# Patient Record
Sex: Female | Born: 1990 | Race: White | Hispanic: No | Marital: Single | Smoking: Former smoker
Health system: Southern US, Community
[De-identification: ages and names within clinical notes are randomized; demographics above are authoritative.]

## PROBLEM LIST (undated history)

## (undated) DIAGNOSIS — N200 Calculus of kidney: Secondary | ICD-10-CM

## (undated) DIAGNOSIS — S36113A Laceration of liver, unspecified degree, initial encounter: Secondary | ICD-10-CM

## (undated) DIAGNOSIS — N83209 Unspecified ovarian cyst, unspecified side: Secondary | ICD-10-CM

## (undated) HISTORY — DX: Rider (driver) (passenger) of other motorcycle injured in unspecified traffic accident, initial encounter: V29.99XA

## (undated) HISTORY — PX: NO PAST SURGERIES: SHX2092

---
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
## 2010-01-15 ENCOUNTER — Inpatient Hospital Stay (HOSPITAL_COMMUNITY): Admission: EM | Admit: 2010-01-15 | Discharge: 2010-01-16 | Payer: Self-pay | Admitting: Emergency Medicine

## 2011-02-13 LAB — COMPREHENSIVE METABOLIC PANEL
Albumin: 3.1 g/dL — ABNORMAL LOW (ref 3.5–5.2)
BUN: 8 mg/dL (ref 6–23)
Creatinine, Ser: 0.55 mg/dL (ref 0.4–1.2)
Total Protein: 5.2 g/dL — ABNORMAL LOW (ref 6.0–8.3)

## 2011-02-13 LAB — CBC
HCT: 31.7 % — ABNORMAL LOW (ref 36.0–46.0)
Hemoglobin: 12.3 g/dL (ref 12.0–15.0)
MCHC: 33.7 g/dL (ref 30.0–36.0)
MCHC: 33.7 g/dL (ref 30.0–36.0)
MCV: 87.5 fL (ref 78.0–100.0)
Platelets: 119 10*3/uL — ABNORMAL LOW (ref 150–400)
RBC: 4.17 MIL/uL (ref 3.87–5.11)
RDW: 14.8 % (ref 11.5–15.5)

## 2011-02-13 LAB — DIFFERENTIAL
Basophils Absolute: 0 10*3/uL (ref 0.0–0.1)
Basophils Relative: 0 % (ref 0–1)
Eosinophils Relative: 1 % (ref 0–5)
Lymphocytes Relative: 25 % (ref 12–46)
Lymphocytes Relative: 9 % — ABNORMAL LOW (ref 12–46)
Monocytes Absolute: 0.6 10*3/uL (ref 0.1–1.0)
Monocytes Relative: 5 % (ref 3–12)
Neutro Abs: 10.9 10*3/uL — ABNORMAL HIGH (ref 1.7–7.7)
Neutro Abs: 4.1 10*3/uL (ref 1.7–7.7)

## 2011-02-13 LAB — URINALYSIS, ROUTINE W REFLEX MICROSCOPIC
Glucose, UA: NEGATIVE mg/dL
Leukocytes, UA: NEGATIVE
Specific Gravity, Urine: >1.03 — ABNORMAL HIGH (ref 1.005–1.030)
pH: 5 (ref 5.0–8.0)

## 2011-02-13 LAB — RAPID URINE DRUG SCREEN, HOSP PERFORMED: Benzodiazepines: NOT DETECTED

## 2011-02-13 LAB — URINE MICROSCOPIC-ADD ON

## 2011-02-13 LAB — URINE CULTURE: Colony Count: >=100000

## 2011-02-13 LAB — BASIC METABOLIC PANEL
CO2: 25 mEq/L (ref 19–32)
Calcium: 9 mg/dL (ref 8.4–10.5)
GFR calc Af Amer: >60 mL/min (ref >60)
Sodium: 140 mEq/L (ref 135–145)

## 2011-04-12 NOTE — H&P (Signed)
Teresa Gamble, Teresa Gamble                        ACCOUNT NO.:  [id number]   MEDICAL RECORD NO.:  [id number]                   PATIENT TYPE:  INP   LOCATION:  A303                                 FACILITY:  [facility]   PHYSICIAN:  Jeoffrey Massed, M.D.             DATE OF BIRTH:  [birth year]   DATE OF ADMISSION:  08/08/2004  DATE OF DISCHARGE:                                HISTORY & PHYSICAL   CHIEF COMPLAINT:  Fever.   HISTORY OF PRESENT ILLNESS:  Teresa Gamble is a 20 year old white female who has  had three to four days of nausea, non-bilious vomiting, generalized  abdominal pain, and low-back pain, and dysuria.  She reports a fever up to  102.7 at home.  She went to the emergency department at [facility] last  night and labs revealed a urinary tract infection, and a CT of the abdomen  and pelvis showed slight evidence of kidney infection but no urinary tract  stones were seen.  She was given IV fluids for about 30 minutes, and given a  prescription for Bactrim and Phenergan and discharged home.  She came to my  office today for followup and says that she is worse.  She is unable to keep  down even sips of clear fluid, and has not been able to take her Bactrim or  Phenergan because of her nausea and vomiting.  She denies any diarrhea, URI  symptoms, cough, or rash.  No vaginal discharge.  She is presently  menstruating, and her last menstrual period was approximately 28 days ago.   PAST MEDICAL HISTORY:  Nonspecific abdominal pain.  Work up normal, likely  secondary to anxiety, and has responded somewhat to Donnatal p.r.n.  No  history of urinary tract infection or stone.   PAST SURGICAL HISTORY:  None.   MEDICATIONS:  1.  Septra.  2.  Donnatal.  3.  Phenergan.   ALLERGIES:  No known drug allergies.   FAMILY HISTORY:  Mother and paternal grandfather with history of kidneys  stones.  Also with hypertension and type 2 diabetes.   SOCIAL HISTORY:  She lives with her mother and father  and her younger sister  in [place].  She attends high school.  She denies any use of tobacco,  alcohol or drugs.   PHYSICAL EXAMINATION:  VITAL SIGNS:  Temperature 100.2, pulse 115 to 125.  Blood pressure 108/76.  Respirations 18 per minute.  Her weight is 137.8  pounds.  GENERAL:  She is alert but lying on the table in a fetal position, and is  quite dramatic on exam.  She is crying intermittently.  HEENT:  Her tympanic membranes have good light reflex and landmarks  bilaterally without lesion.  Her nasal passages are patent bilaterally.  Oropharynx reveals pink mucosa with slightly tacky mucous membranes.  No  erythema, exudate or swelling.  NECK:  Supple without lymphadenopathy or thyromegaly.  LUNGS:  Clear to auscultation  bilaterally with non-labored respirations.  CARDIOVASCULAR:  Regular rhythm, tachycardic, no murmur.  ABDOMEN:  Soft, moderately tender diffusely without guarding or rebound.  The patient points to the left lower-quadrant as her area of most  tenderness.  Organomegaly difficult to assess because of severe tenderness.  EXTREMITIES:  Capillary refill 1-2 seconds, no edema or cyanosis.  BACK:  Significant CVA tenderness with very light touch bilaterally.   LABORATORY DATA:  In the emergency department, on August 07, 2004, a  catheterized urinalysis showed large blood, 15 ketones, and small  leukocytes.  The microscopic exam showed 11-20 white blood cells per high-  powered field, and 21 to 50 red blood cells per high-powered field.  Urine  pregnancy test was negative.  A hepatic panel was all within normal limits  as was amylase and lipase.  Metabolic 7 panel was within normal limits with  the exception of a sodium of 133.  Complete blood count showed a white blood  cell count of 11,900 with 87% neutrophils, 7% lymphocytes.  Hemoglobin 13.4.  Platelet count 156,000.  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  There are  no urinary tract calcifications noted.  The appendix was not visualized.  There was some scattered air fluid levels in the non-dilated small bowel  consistent with mild ileus versus enteritis.   ASSESSMENT/PLAN:  1.  Pyelonephritis.  The patient seems to have worsened overnight.  2.  The plan is for admission to the hospital with IV Cipro 400 mg q.12h.,      IV Phenergan p.r.n., IV fluids and pain medication as needed.  3.  Complete plan discussed with the patient and mother and they are in      agreement.     ___________________________________________                                         Jeoffrey Massed, M.D.   PHM/MEDQ  D:  08/08/2004  T:  08/08/2004  Job:  [id number]

## 2011-04-12 NOTE — Discharge Summary (Signed)
NAMEJORDANNE, ELSBURY              ACCOUNT NO.:  [id number]   MEDICAL RECORD NO.:  [id number]          PATIENT TYPE:  INP   LOCATION:  A303                          FACILITY:  [facility]   PHYSICIAN:  Jeoffrey Massed, M.D.DATE OF BIRTH:  1991/01/20   DATE OF ADMISSION:  08/08/2004  DATE OF DISCHARGE:  09/17/2005LH                                 DISCHARGE SUMMARY   ADMISSION DIAGNOSES:  1.  Pyelonephritis.  2.  Dehydration.   DISCHARGE DIAGNOSES:  1.  Pyelonephritis.  2.  Dehydration.   DISCHARGE MEDICATION:  Ciprofloxacin 250 mg tabs, one tab by mouth twice  daily for seven days.   CONSULTATIONS:  None.   PROCEDURES:  None.   HISTORY AND PHYSICAL:  For complete H&P, please see dictated H&P in chart.  Briefly, this is a 20 year old white female who was admitted for fever,  flank pain, dysuria, and generalized abdominal pain with vomiting.  She was  found to be dehydrated and had failed one attempt at outpatient management.  She was admitted to the hospital.   HOSPITAL COURSE BY PROBLEM:  1.  Pyelonephritis:  Initial urine culture done in the emergency department      prior to admission was not sent for culture.  The patient was      immediately begun on ciprofloxacin IV in the hospital and a cath urine      sample was sent for culture after the first dose of this medication.      The urine culture came back no growth, but since this was done after the      first dose of antibiotics, she was discharged home on Cipro to complete      a full 10-day course of antibiotics.  Upon admission, she was found to be mildly hypokalemia with a potassium of  3.3, and this was replaced via her IV fluids and was in the normal range on  recheck two days later.  The patient's white blood cell count was normal on  admission and remained so.  Her fever peaked at 103, and this was within the  first 24 hours of hospitalization.  She then defervesced and had been  afebrile for 24 hours prior to  discharge.  1.  Dehydration:  The patient was approximately 5% dehydrated on admission      and was unable to take anything by mouth.  She was immediately started      on IV fluids after admission, and started on a clear liquid diet, and      this was advanced as tolerated.  She gradually was able to tolerate      fluids by mouth, her diet was advanced and was normal by her discharge.      She maintained a good urine output after admission.   PERTINENT LABORATORY DATA:  August 10, 2004:  Electrolytes showed a  sodium of 139, potassium 3.8, chloride 106, bicarbonate 26, BUN 2,  creatinine 0.7, glucose 123, calcium 8.7.  CBC on August 10, 2004, showed  a white blood cell count of 4.1, hemoglobin 10.9, platelets 159,000.  Urine  culture:  No growth one day.   DISPOSITION:  The patient was discharged to home with her mother on the  previously-mentioned discharged medications, in significantly-improved  condition.   FOLLOWUP:  She was told to follow up with our office at [facility] as needed.      PHM/MEDQ  D:  08/15/2004  T:  08/16/2004  Job:  [id number]

## 2011-04-12 NOTE — Discharge Summary (Signed)
Teresa Gamble, Teresa Gamble              ACCOUNT NO.:  [id number]   MEDICAL RECORD NO.:  [id number]          PATIENT TYPE:  INP   LOCATION:  6116                         FACILITY:  [facility]   PHYSICIAN:  Dyann Ruddle, MDDATE OF BIRTH:  [birth year]   DATE OF ADMISSION:  11/26/2005  DATE OF DISCHARGE:  11/27/2005                                 DISCHARGE SUMMARY   HOSPITAL COURSE:  This is a 20 year old female who presented to the ED  following a three day history of flank pain, dysuria, frequent urination,  and a one day history of inability to tolerate food or medicine by mouth due  to nausea and vomiting.  Initial labs at the time of admission showed  urinalysis with positive nitrites, positive moderate leukocyte esterase.  CBC within normal limits and normal electrolytes including a creatinine of  0.8.  The patient's exam was consistent with pyelonephritis including  costovertebral angle tenderness but no signs of suprapubic tenderness.  The  patient was begun on IV Ciprofloxacin for her UTI, IV morphine for pain, and  IV Zofran for nausea.  She responded well to these and was able to tolerate  a regular diet on the evening of November 26, 2005.  On November 27, 2005, the  patient's urine culture grew greater than 10,000 colonies E. coli and the  patient was switched to oral Cipro for management of her infection.  The  patient was also switched to Tylenol #3 for pain.  She continued to tolerate  p.o. medications as well as diet with no further nausea and vomiting.  The  patient is discharged on p.o. antibiotics for a full 14 day course of  treatment for pyelonephritis.   DISCHARGE DIAGNOSIS:  Pyelonephritis.   DISCHARGE MEDICATIONS:  1.  Cipro XR 1000 mg p.o. daily for 12 more days, prescription written.  2.  Tylenol #3, 1-2 tablets p.o. q.4-6h. p.r.n. severe pain, prescription      written.   The patient's discharge weight was 60.6 kilograms.  The patient's discharge  condition is  improved and stable.   DISCHARGE INSTRUCTIONS:  The patient has a follow up appointment scheduled  with her primary MD for Friday, November 29, 2005, at 10:30 a.m.  This is with  Dr. Marvel Plan at [facility], number [phone].  The  patient is to return to the emergency department if unable to tolerate oral  medication or for severe nausea and vomiting, or for fever greater than  102.5 that is not relieved by Motrin.     ______________________________  Pediatrics Resident    ______________________________  Dyann Ruddle, MD    PR/MEDQ  D:  11/27/2005  T:  11/27/2005  Job:  [id number]

## 2011-08-20 ENCOUNTER — Emergency Department (HOSPITAL_COMMUNITY)
Admission: EM | Admit: 2011-08-20 | Discharge: 2011-08-20 | Discharge status: Against Medical Advice | Payer: Self-pay | Attending: Emergency Medicine | Admitting: Emergency Medicine

## 2011-08-20 ENCOUNTER — Emergency Department (HOSPITAL_COMMUNITY)
Admission: EM | Admit: 2011-08-20 | Discharge: 2011-08-21 | Discharge status: Against Medical Advice | Payer: Self-pay | Attending: Emergency Medicine | Admitting: Emergency Medicine

## 2011-08-20 DIAGNOSIS — R52 Pain, unspecified: Secondary | ICD-10-CM | POA: Insufficient documentation

## 2011-08-20 DIAGNOSIS — J989 Respiratory disorder, unspecified: Secondary | ICD-10-CM | POA: Insufficient documentation

## 2011-08-20 DIAGNOSIS — R070 Pain in throat: Secondary | ICD-10-CM | POA: Insufficient documentation

## 2011-08-20 DIAGNOSIS — M545 Low back pain, unspecified: Secondary | ICD-10-CM | POA: Insufficient documentation

## 2011-08-20 DIAGNOSIS — J3489 Other specified disorders of nose and nasal sinuses: Secondary | ICD-10-CM | POA: Insufficient documentation

## 2011-08-20 DIAGNOSIS — R509 Fever, unspecified: Secondary | ICD-10-CM | POA: Insufficient documentation

## 2011-08-20 DIAGNOSIS — R3989 Other symptoms and signs involving the genitourinary system: Secondary | ICD-10-CM | POA: Insufficient documentation

## 2011-08-20 DIAGNOSIS — Z5320 Procedure and treatment not carried out because of patient's decision for unspecified reasons: Secondary | ICD-10-CM | POA: Insufficient documentation

## 2011-08-21 NOTE — ED Notes (Signed)
Pt called numerous times and not found in ed or lobby

## 2011-12-21 ENCOUNTER — Encounter (HOSPITAL_COMMUNITY): Payer: Self-pay | Admitting: Emergency Medicine

## 2011-12-21 ENCOUNTER — Emergency Department (HOSPITAL_COMMUNITY)
Admission: EM | Admit: 2011-12-21 | Discharge: 2011-12-21 | Disposition: A | Discharge status: Home / Self Care | Payer: Self-pay | Attending: Emergency Medicine | Admitting: Emergency Medicine

## 2011-12-21 DIAGNOSIS — R300 Dysuria: Secondary | ICD-10-CM | POA: Insufficient documentation

## 2011-12-21 DIAGNOSIS — R3915 Urgency of urination: Secondary | ICD-10-CM | POA: Insufficient documentation

## 2011-12-21 DIAGNOSIS — R350 Frequency of micturition: Secondary | ICD-10-CM | POA: Insufficient documentation

## 2011-12-21 LAB — URINALYSIS, ROUTINE W REFLEX MICROSCOPIC
Ketones, ur: NEGATIVE mg/dL
Leukocytes, UA: NEGATIVE
Nitrite: NEGATIVE
pH: 6.5 (ref 5.0–8.0)

## 2011-12-21 LAB — GLUCOSE, CAPILLARY

## 2011-12-21 LAB — URINE MICROSCOPIC-ADD ON

## 2011-12-21 MED ORDER — ONDANSETRON HCL 8 MG PO TABS: 8.0000 mg | ORAL_TABLET | Freq: Three times a day (TID) | ORAL | Status: AC | PRN

## 2011-12-21 MED ORDER — CEPHALEXIN 500 MG PO CAPS: 500.0000 mg | ORAL_CAPSULE | Freq: Four times a day (QID) | ORAL | Status: AC

## 2011-12-21 MED ORDER — ONDANSETRON 4 MG PO TBDP
8.0000 mg | ORAL_TABLET | Freq: Once | ORAL | Status: AC
  Administered 2011-12-21: 8 mg via ORAL
  Filled 2011-12-21: qty 2

## 2011-12-21 NOTE — ED Provider Notes (Signed)
History     CSN: [id number]  Arrival date & time 12/21/11  1336   First MD Initiated Contact with Patient 12/21/11 1411      Chief Complaint  Patient presents with  . Urinary Frequency    (Consider location/radiation/quality/duration/timing/severity/associated sxs/prior treatment) Patient is a 21 y.o. female presenting with frequency. The history is provided by the patient and a relative.  Urinary Frequency Pertinent negatives include no chest pain, no abdominal pain, no headaches and no shortness of breath.  pt c/o urinary frequency, urgency for past 1-2 weeks. Hx same. No flank pain. No fever or chills. No abd pain. No nvd. Is on period now, states normal for her, normal time. No vaginal discharge. Hx uncomplicated uti, states saw pcp, [facility]oper, in [place] for same, was given doxy for possible uti, but quit taking as makes nauseated. Pt denies exacerbating or alleviating factors. States no other associated symptoms.   History reviewed. No pertinent past medical history.  No past surgical history on file.  No family history on file.  History  Substance Use Topics  . Smoking status: Current Everyday Smoker -- 0.5 packs/day for 5 years  . Smokeless tobacco: Not on file  . Alcohol Use: No    OB History    Grav Para Term Preterm Abortions TAB SAB Ect Mult Living                  Review of Systems  Constitutional: Negative for fever and chills.  HENT: Negative for neck pain.   Eyes: Negative for redness.  Respiratory: Negative for shortness of breath.   Cardiovascular: Negative for chest pain.  Gastrointestinal: Negative for abdominal pain.  Genitourinary: Positive for dysuria, urgency and frequency. Negative for flank pain.  Musculoskeletal: Negative for back pain.  Skin: Negative for rash.  Neurological: Negative for headaches.  Hematological: Does not bruise/bleed easily.  Psychiatric/Behavioral: Negative for confusion.    Allergies  Review of patient's allergies  indicates no known allergies.  Home Medications   Current Outpatient Rx  Name Route Sig Dispense Refill  . GUAIFENESIN ER 600 MG PO TB12 Oral Take 1,200 mg by mouth 2 (two) times daily.    Marland Kitchen NORGESTIM-ETH ESTRAD TRIPHASIC 0.18/0.215/0.25 MG-35 MCG PO TABS Oral Take 1 tablet by mouth daily.    . AZO-STANDARD PO Oral Take 1 tablet by mouth daily as needed. For UTI pain    . PRESCRIPTION MEDICATION Injection Inject 200 mcg as directed daily. HCG injections      BP 111/82  Pulse 88  Temp(Src) 98.2 F (36.8 C) (Oral)  Resp 16  SpO2 98%  LMP 12/20/2011  Physical Exam  Nursing note and vitals reviewed. Constitutional: She appears well-developed and well-nourished. No distress.  Eyes: Conjunctivae are normal. No scleral icterus.  Neck: Neck supple. No tracheal deviation present.  Cardiovascular: Normal rate.   Pulmonary/Chest: Effort normal. No respiratory distress.  Abdominal: Soft. Normal appearance and bowel sounds are normal. She exhibits no distension and no mass. There is no tenderness. There is no rebound and no guarding.  Genitourinary:       No cva tenderness  Musculoskeletal: She exhibits no edema.  Neurological: She is alert.  Skin: Skin is warm and dry. No rash noted.  Psychiatric: She has a normal mood and affect.    ED Course  Procedures (including critical care time)   Labs Reviewed  POCT PREGNANCY, URINE  URINALYSIS, ROUTINE W REFLEX MICROSCOPIC  POCT PREGNANCY, URINE  POCT CBG MONITORING   Results for  orders placed during the hospital encounter of 12/21/11  URINALYSIS, ROUTINE W REFLEX MICROSCOPIC      Component Value Range   Color, Urine YELLOW  YELLOW    APPearance CLOUDY (*) CLEAR    Specific Gravity, Urine 1.025  1.005 - 1.030    pH 6.5  5.0 - 8.0    Glucose, UA NEGATIVE  NEGATIVE (mg/dL)   Hgb urine dipstick SMALL (*) NEGATIVE    Bilirubin Urine NEGATIVE  NEGATIVE    Ketones, ur NEGATIVE  NEGATIVE (mg/dL)   Protein, ur NEGATIVE  NEGATIVE (mg/dL)     Urobilinogen, UA 1.0  0.0 - 1.0 (mg/dL)   Nitrite NEGATIVE  NEGATIVE    Leukocytes, UA NEGATIVE  NEGATIVE   POCT PREGNANCY, URINE      Component Value Range   Preg Test, Ur NEGATIVE  NEGATIVE   GLUCOSE, CAPILLARY      Component Value Range   Glucose-Capillary 82  70 - 99 (mg/dL)   Comment 1 Documented in Chart    URINE MICROSCOPIC-ADD ON      Component Value Range   Squamous Epithelial / LPF MANY (*) RARE    WBC, UA 0-2  <3 (WBC/hpf)   RBC / HPF 0-2  <3 (RBC/hpf)   Bacteria, UA RARE  RARE        MDM  ua sent.   Last u cx pos for e coli, sens keflex, res cipro. As urinary symptoms persist after report pos ua as outpt, and ?recent partial abx tx for uti, pt/parent request rx for possible partially treated uti (discussed that current ua unremark), will rx keflex. abd soft nt.       Suzi Roots, MD 12/21/11 1515

## 2011-12-21 NOTE — ED Notes (Signed)
Pt. Stated, I went to see Dr. Quintella Reichert in [place] and was told I have a UTI and a sinus infection, Rx for Doxocycline, "made me sick so I didn't take it.  Still sick.  Pt didn't take the medicine.  "I took a shot of an antibiotic when I was there but I don't know what it is."

## 2012-07-30 ENCOUNTER — Encounter (HOSPITAL_COMMUNITY): Payer: Self-pay

## 2012-07-30 ENCOUNTER — Emergency Department (HOSPITAL_COMMUNITY)
Admission: EM | Admit: 2012-07-30 | Discharge: 2012-07-31 | Disposition: A | Discharge status: Home / Self Care | Payer: Self-pay | Attending: Emergency Medicine | Admitting: Emergency Medicine

## 2012-07-30 DIAGNOSIS — R509 Fever, unspecified: Secondary | ICD-10-CM

## 2012-07-30 DIAGNOSIS — Z79899 Other long term (current) drug therapy: Secondary | ICD-10-CM | POA: Insufficient documentation

## 2012-07-30 DIAGNOSIS — A499 Bacterial infection, unspecified: Secondary | ICD-10-CM | POA: Insufficient documentation

## 2012-07-30 DIAGNOSIS — F17200 Nicotine dependence, unspecified, uncomplicated: Secondary | ICD-10-CM | POA: Insufficient documentation

## 2012-07-30 DIAGNOSIS — B9689 Other specified bacterial agents as the cause of diseases classified elsewhere: Secondary | ICD-10-CM | POA: Insufficient documentation

## 2012-07-30 DIAGNOSIS — R109 Unspecified abdominal pain: Secondary | ICD-10-CM

## 2012-07-30 DIAGNOSIS — N390 Urinary tract infection, site not specified: Secondary | ICD-10-CM | POA: Insufficient documentation

## 2012-07-30 DIAGNOSIS — N760 Acute vaginitis: Secondary | ICD-10-CM | POA: Insufficient documentation

## 2012-07-30 LAB — CBC WITH DIFFERENTIAL/PLATELET
HCT: 45.7 % (ref 36.0–46.0)
Hemoglobin: 15.6 g/dL — ABNORMAL HIGH (ref 12.0–15.0)
Lymphocytes Relative: 7 % — ABNORMAL LOW (ref 12–46)
Lymphs Abs: 0.7 10*3/uL (ref 0.7–4.0)
MCHC: 34.1 g/dL (ref 30.0–36.0)
Monocytes Absolute: 0.7 10*3/uL (ref 0.1–1.0)
Monocytes Relative: 8 % (ref 3–12)
Neutro Abs: 8.4 10*3/uL — ABNORMAL HIGH (ref 1.7–7.7)
RBC: 5.11 MIL/uL (ref 3.87–5.11)
WBC: 9.8 10*3/uL (ref 4.0–10.5)

## 2012-07-30 LAB — URINE MICROSCOPIC-ADD ON

## 2012-07-30 LAB — COMPREHENSIVE METABOLIC PANEL
Alkaline Phosphatase: 72 U/L (ref 39–117)
BUN: 7 mg/dL (ref 6–23)
CO2: 26 mEq/L (ref 19–32)
Chloride: 98 mEq/L (ref 96–112)
Creatinine, Ser: 0.95 mg/dL (ref 0.50–1.10)
GFR calc non Af Amer: 84 mL/min — ABNORMAL LOW (ref >90)
Glucose, Bld: 119 mg/dL — ABNORMAL HIGH (ref 70–99)
Total Bilirubin: 0.3 mg/dL (ref 0.3–1.2)

## 2012-07-30 LAB — URINALYSIS, ROUTINE W REFLEX MICROSCOPIC
Hgb urine dipstick: NEGATIVE
Ketones, ur: 15 mg/dL — AB
Protein, ur: 30 mg/dL — AB
Urobilinogen, UA: 1 mg/dL (ref 0.0–1.0)

## 2012-07-30 LAB — POCT PREGNANCY, URINE: Preg Test, Ur: NEGATIVE

## 2012-07-30 MED ORDER — MORPHINE SULFATE 4 MG/ML IJ SOLN
4.0000 mg | Freq: Once | INTRAMUSCULAR | Status: AC
  Administered 2012-07-31: 4 mg via INTRAVENOUS
  Filled 2012-07-30: qty 1

## 2012-07-30 MED ORDER — DEXTROSE 5 % IV SOLN
1.0000 g | Freq: Once | INTRAVENOUS | Status: AC
  Administered 2012-07-31: 1 g via INTRAVENOUS
  Filled 2012-07-30: qty 10

## 2012-07-30 MED ORDER — ONDANSETRON HCL 4 MG/2ML IJ SOLN
4.0000 mg | Freq: Once | INTRAMUSCULAR | Status: AC
  Administered 2012-07-31: 4 mg via INTRAVENOUS
  Filled 2012-07-30: qty 2

## 2012-07-30 MED ORDER — ACETAMINOPHEN 325 MG PO TABS
650.0000 mg | ORAL_TABLET | Freq: Once | ORAL | Status: AC
  Administered 2012-07-30: 650 mg via ORAL
  Filled 2012-07-30: qty 2

## 2012-07-30 MED ORDER — SODIUM CHLORIDE 0.9 % IV BOLUS (SEPSIS)
1000.0000 mL | Freq: Once | INTRAVENOUS | Status: AC
  Administered 2012-07-31: 1000 mL via INTRAVENOUS

## 2012-07-30 NOTE — ED Notes (Signed)
Pt reports waking this am w/all over body aches, lower abd/hip/low back pain, burning w/urination, dizziness and fever. Pt reports hx of kidney stones, reports her symptoms feel similar.

## 2012-07-31 ENCOUNTER — Encounter (HOSPITAL_COMMUNITY): Payer: Self-pay | Admitting: Radiology

## 2012-07-31 ENCOUNTER — Emergency Department (HOSPITAL_COMMUNITY): Payer: Self-pay

## 2012-07-31 LAB — WET PREP, GENITAL
Trich, Wet Prep: NONE SEEN
Yeast Wet Prep HPF POC: NONE SEEN

## 2012-07-31 MED ORDER — OXYCODONE-ACETAMINOPHEN 5-325 MG PO TABS: 2.0000 | ORAL_TABLET | ORAL | Status: AC | PRN

## 2012-07-31 MED ORDER — MORPHINE SULFATE 4 MG/ML IJ SOLN
4.0000 mg | Freq: Once | INTRAMUSCULAR | Status: AC
  Administered 2012-07-31: 4 mg via INTRAVENOUS
  Filled 2012-07-31: qty 1

## 2012-07-31 MED ORDER — IOHEXOL 300 MG/ML  SOLN
100.0000 mL | Freq: Once | INTRAMUSCULAR | Status: AC | PRN
  Administered 2012-07-31: 100 mL via INTRAVENOUS

## 2012-07-31 MED ORDER — IOHEXOL 300 MG/ML  SOLN
20.0000 mL | INTRAMUSCULAR | Status: AC
  Administered 2012-07-31: 20 mL via ORAL

## 2012-07-31 MED ORDER — SODIUM CHLORIDE 0.9 % IV BOLUS (SEPSIS)
1000.0000 mL | Freq: Once | INTRAVENOUS | Status: AC
  Administered 2012-07-31: 1000 mL via INTRAVENOUS

## 2012-07-31 MED ORDER — CIPROFLOXACIN IN D5W 400 MG/200ML IV SOLN
400.0000 mg | Freq: Once | INTRAVENOUS | Status: AC
  Administered 2012-07-31: 400 mg via INTRAVENOUS
  Filled 2012-07-31: qty 200

## 2012-07-31 MED ORDER — CIPROFLOXACIN HCL 500 MG PO TABS: 500.0000 mg | ORAL_TABLET | Freq: Two times a day (BID) | ORAL | Status: AC

## 2012-07-31 MED ORDER — METRONIDAZOLE 0.75 % VA GEL: 1.0000 | Freq: Two times a day (BID) | VAGINAL | Status: AC

## 2012-07-31 NOTE — ED Provider Notes (Signed)
History     CSN: [id number]  Arrival date & time 07/30/12  2025   First MD Initiated Contact with Patient 07/30/12 2333      Chief Complaint  Patient presents with  . Abdominal Pain    (Consider location/radiation/quality/duration/timing/severity/associated sxs/prior treatment) HPI 21 year old female presents emergency department complaining of diffuse body aches, fever, back pain, abdominal pain, dysuria, dizziness, nausea without vomiting. No sick contacts, no rash, no tick bites. Patient with history of urinary tract infections and kidney stones. She reports her flank pain is bilateral slightly worse on the left. She denies any vaginal discharge. No new sexual partners.  History reviewed. No pertinent past medical history.  History reviewed. No pertinent past surgical history.  History reviewed. No pertinent family history.  History  Substance Use Topics  . Smoking status: Current Everyday Smoker -- 0.5 packs/day for 5 years  . Smokeless tobacco: Not on file  . Alcohol Use: No    OB History    Grav Para Term Preterm Abortions TAB SAB Ect Mult Living                  Review of Systems  All other systems reviewed and are negative.    Allergies  Shellfish allergy  Home Medications   Current Outpatient Rx  Name Route Sig Dispense Refill  . ACETAMINOPHEN 500 MG PO TABS Oral Take 1,000 mg by mouth every 6 (six) hours as needed. For pain    . CYANOCOBALAMIN 500 MCG PO TABS Oral Take 500 mcg by mouth daily.    . ADULT MULTIVITAMIN W/MINERALS CH Oral Take 1 tablet by mouth daily.    Marland Kitchen NORGESTIM-ETH ESTRAD TRIPHASIC 0.18/0.215/0.25 MG-35 MCG PO TABS Oral Take 1 tablet by mouth daily.    Marland Kitchen CIPROFLOXACIN HCL 500 MG PO TABS Oral Take 1 tablet (500 mg total) by mouth every 12 (twelve) hours. 10 tablet 0  . METRONIDAZOLE 0.75 % VA GEL Vaginal Place 1 Applicatorful vaginally 2 (two) times daily. 70 g 0  . OXYCODONE-ACETAMINOPHEN 5-325 MG PO TABS Oral Take 2 tablets by mouth  every 4 (four) hours as needed for pain. 20 tablet 0    BP 105/68  Pulse 80  Temp 99.2 F (37.3 C) (Oral)  Resp 16  SpO2 99%  LMP 07/13/2012  Physical Exam  Nursing note and vitals reviewed. Constitutional: She is oriented to person, place, and time. She appears well-developed and well-nourished. She appears distressed (Uncomfortable, ill-appearing).  HENT:  Head: Normocephalic and atraumatic.  Right Ear: External ear normal.  Left Ear: External ear normal.  Nose: Nose normal.  Mouth/Throat: Oropharynx is clear and moist.  Eyes: Conjunctivae and EOM are normal. Pupils are equal, round, and reactive to light.  Neck: Normal range of motion. Neck supple. No JVD present. No tracheal deviation present. No thyromegaly present.  Cardiovascular: Normal rate, regular rhythm, normal heart sounds and intact distal pulses.  Exam reveals no gallop and no friction rub.   No murmur heard. Pulmonary/Chest: Effort normal and breath sounds normal. No stridor. No respiratory distress. She has no wheezes. She has no rales. She exhibits no tenderness.  Abdominal: Soft. Bowel sounds are normal. She exhibits no distension and no mass. There is tenderness (diffuse abdominal pain without rebound or guarding). There is no rebound and no guarding.       Bilateral CVA tenderness  Genitourinary: Uterus normal. Vaginal discharge found.       Thick white profuse discharge. No adnexal tenderness no uterine tenderness no cervical  motion tenderness external genitalia are normal  Musculoskeletal: Normal range of motion. She exhibits no edema and no tenderness.  Lymphadenopathy:    She has no cervical adenopathy.  Neurological: She is alert and oriented to person, place, and time. She exhibits normal muscle tone. Coordination normal.  Skin: Skin is warm and dry. No rash noted. No erythema. No pallor.  Psychiatric: She has a normal mood and affect. Her behavior is normal. Judgment and thought content normal.    ED  Course  Procedures (including critical care time)  Labs Reviewed  CBC WITH DIFFERENTIAL - Abnormal; Notable for the following:    Hemoglobin 15.6 (*)     Neutrophils Relative 86 (*)     Neutro Abs 8.4 (*)     Lymphocytes Relative 7 (*)     All other components within normal limits  COMPREHENSIVE METABOLIC PANEL - Abnormal; Notable for the following:    Glucose, Bld 119 (*)     GFR calc non Af Amer 84 (*)     All other components within normal limits  URINALYSIS, ROUTINE W REFLEX MICROSCOPIC - Abnormal; Notable for the following:    APPearance CLOUDY (*)     Ketones, ur 15 (*)     Protein, ur 30 (*)     Nitrite POSITIVE (*)     Leukocytes, UA SMALL (*)     All other components within normal limits  URINE MICROSCOPIC-ADD ON - Abnormal; Notable for the following:    Squamous Epithelial / LPF FEW (*)     Bacteria, UA MANY (*)     All other components within normal limits  WET PREP, GENITAL - Abnormal; Notable for the following:    Clue Cells Wet Prep HPF POC TOO NUMEROUS TO COUNT (*)     WBC, Wet Prep HPF POC FEW (*)     All other components within normal limits  POCT PREGNANCY, URINE  GC/CHLAMYDIA PROBE AMP, GENITAL  URINE CULTURE   Ct Abdomen Pelvis W Contrast  07/31/2012  *RADIOLOGY REPORT*  Clinical Data: Fever, diffuse abdominal pain, body aches; lower abdominal, hip, and low back pain, burning with urination, dizziness, history kidney stones  CT ABDOMEN AND PELVIS WITH CONTRAST  Technique:  Multidetector CT imaging of the abdomen and pelvis was performed following the standard protocol during bolus administration of intravenous contrast.  Contrast: 1 OMNIPAQUE IOHEXOL 300 MG/ML  SOLN, OMNIPAQUE IOHEXOL 300 MG/ML  SOLN Dilute oral contrast.  Comparison: 06/30/2010  Findings: Lung bases clear. Spleen appears mildly enlarged. Liver, spleen, pancreas, and adrenal glands normal appearance. Symmetric nephrograms with cortical scarring noted at left kidney. No urinary tract  calcification or dilatation. Normal appendix. Unremarkable uterus, adnexae, ureters, bladder. Stomach and bowel loops unremarkable. No mass, adenopathy, free fluid, or inflammatory process. Scattered normal-sized mesenteric lymph nodes. No acute osseous findings.  IMPRESSION: Cortical scarring left kidney. Otherwise negative exam.   Original Report Authenticated By: Lollie Marrow, M.D.      1. Fever   2. Urinary tract infection   3. Abdominal pain   4. Bacterial vaginosis       MDM  21 year old female with abdominal pain fever. Workup shows possible urinary tract infection as source. Patient has received IV Rocephin and Cipro, was sent home on Cipro. Cultures have been sent. Patient feeling better after treatment. She's been given for precautions for return.        Olivia Mackie, MD 07/31/12 [phone]

## 2012-08-01 LAB — URINE CULTURE

## 2012-08-02 NOTE — ED Notes (Signed)
+  Urine. Patient treated with Cipro. Sensitive to same. Per protocol MD. °

## 2012-08-03 LAB — GC/CHLAMYDIA PROBE AMP, GENITAL: Chlamydia, DNA Probe: NEGATIVE

## 2012-11-03 ENCOUNTER — Other Ambulatory Visit (HOSPITAL_COMMUNITY)
Admission: RE | Admit: 2012-11-03 | Discharge: 2012-11-03 | Disposition: A | Discharge status: Home / Self Care | Payer: Self-pay | Source: Ambulatory Visit | Attending: Family Medicine | Admitting: Family Medicine

## 2012-11-03 ENCOUNTER — Emergency Department (INDEPENDENT_AMBULATORY_CARE_PROVIDER_SITE_OTHER)
Admission: EM | Admit: 2012-11-03 | Discharge: 2012-11-03 | Disposition: A | Discharge status: Home / Self Care | Payer: Self-pay | Source: Home / Self Care | Attending: Family Medicine | Admitting: Family Medicine

## 2012-11-03 ENCOUNTER — Encounter (HOSPITAL_COMMUNITY): Payer: Self-pay | Admitting: *Deleted

## 2012-11-03 DIAGNOSIS — N390 Urinary tract infection, site not specified: Secondary | ICD-10-CM

## 2012-11-03 DIAGNOSIS — Z113 Encounter for screening for infections with a predominantly sexual mode of transmission: Secondary | ICD-10-CM | POA: Insufficient documentation

## 2012-11-03 DIAGNOSIS — N946 Dysmenorrhea, unspecified: Secondary | ICD-10-CM

## 2012-11-03 LAB — POCT PREGNANCY, URINE: Preg Test, Ur: NEGATIVE

## 2012-11-03 LAB — POCT URINALYSIS DIP (DEVICE)
Bilirubin Urine: NEGATIVE
Glucose, UA: NEGATIVE mg/dL
Hgb urine dipstick: NEGATIVE
Nitrite: POSITIVE — AB
Specific Gravity, Urine: 1.025 (ref 1.005–1.030)

## 2012-11-03 MED ORDER — IBUPROFEN 800 MG PO TABS
800.0000 mg | ORAL_TABLET | Freq: Once | ORAL | Status: AC
  Administered 2012-11-03: 800 mg via ORAL

## 2012-11-03 MED ORDER — IBUPROFEN 600 MG PO TABS: 600.0000 mg | ORAL_TABLET | Freq: Three times a day (TID) | ORAL | Status: DC | PRN

## 2012-11-03 MED ORDER — HYDROCODONE-ACETAMINOPHEN 5-325 MG PO TABS: 2.0000 | ORAL_TABLET | Freq: Four times a day (QID) | ORAL | Status: DC | PRN

## 2012-11-03 MED ORDER — IBUPROFEN 800 MG PO TABS
ORAL_TABLET | ORAL | Status: AC
  Filled 2012-11-03: qty 1

## 2012-11-03 MED ORDER — PROMETHAZINE HCL 25 MG PO TABS: 25.0000 mg | ORAL_TABLET | Freq: Three times a day (TID) | ORAL | Status: DC | PRN

## 2012-11-03 MED ORDER — CIPROFLOXACIN HCL 500 MG PO TABS: 500.0000 mg | ORAL_TABLET | Freq: Two times a day (BID) | ORAL | Status: DC

## 2012-11-03 NOTE — ED Notes (Signed)
Pt  Reports  Symptoms  Of  Low  abd   Cramping  As  Well  As  Back  Pain    Which  Started  yest     She  denys  Any  Vaginal bleeding or  Discharge        Symptoms  Started  yest           Has  Had  Ovarian  Cyst in past

## 2012-11-03 NOTE — ED Provider Notes (Signed)
History     CSN: [id number]  Arrival date & time 11/03/12  1035   First MD Initiated Contact with Patient 11/03/12 1120      Chief Complaint  Patient presents with  . Abdominal Cramping    (Consider location/radiation/quality/duration/timing/severity/associated sxs/prior treatment) HPI Comments: 21 year old female smoker, with a recurrent history of of urinary tract infection and kidney stones. Was treated in September for UTI and  Her urine culture grew Escherichia coli sensitive to Cipro and cephalosporins. Here complaining of low abdominal cramping and low back pain since last night worse on the left. She is on birth control pills for the last 4 years as she had a history of ovarian cyst. She is due for her period today. Has had frequency but denies burning on urination. Denies vaginal discharge. No fever or chills. No headache. She has felt nauseous but no vomiting. No diarrhea.   History reviewed. No pertinent past medical history.  History reviewed. No pertinent past surgical history.  No family history on file.  History  Substance Use Topics  . Smoking status: Current Every Day Smoker -- 0.5 packs/day for 5 years  . Smokeless tobacco: Not on file  . Alcohol Use: No    OB History    Grav Para Term Preterm Abortions TAB SAB Ect Mult Living                  Review of Systems  Constitutional: Negative for fever, chills, diaphoresis and appetite change.  HENT: Negative for congestion.   Gastrointestinal: Positive for nausea. Negative for vomiting and diarrhea.  Genitourinary: Positive for pelvic pain. Negative for dysuria, vaginal bleeding and vaginal discharge.  Musculoskeletal: Positive for back pain.  Skin: Negative for rash.  Neurological: Negative for dizziness and headaches.    Allergies  Shellfish allergy  Home Medications   Current Outpatient Rx  Name  Route  Sig  Dispense  Refill  . ACETAMINOPHEN 500 MG PO TABS   Oral   Take 1,000 mg by mouth every  6 (six) hours as needed. For pain         . CIPROFLOXACIN HCL 500 MG PO TABS   Oral   Take 1 tablet (500 mg total) by mouth every 12 (twelve) hours.   14 tablet   0   . CYANOCOBALAMIN 500 MCG PO TABS   Oral   Take 500 mcg by mouth daily.         Marland Kitchen HYDROCODONE-ACETAMINOPHEN 5-325 MG PO TABS   Oral   Take 2 tablets by mouth every 6 (six) hours as needed for pain.   10 tablet   0   . IBUPROFEN 600 MG PO TABS   Oral   Take 1 tablet (600 mg total) by mouth every 8 (eight) hours as needed for pain.   20 tablet   0   . ADULT MULTIVITAMIN W/MINERALS CH   Oral   Take 1 tablet by mouth daily.         Marland Kitchen NORGESTIM-ETH ESTRAD TRIPHASIC 0.18/0.215/0.25 MG-35 MCG PO TABS   Oral   Take 1 tablet by mouth daily.         Marland Kitchen PROMETHAZINE HCL 25 MG PO TABS   Oral   Take 1 tablet (25 mg total) by mouth every 8 (eight) hours as needed for nausea.   10 tablet   0     BP 122/70  Pulse 70  Temp 98.6 F (37 C) (Oral)  Resp 16  SpO2 100%  LMP  10/04/2012  Physical Exam  Nursing note and vitals reviewed. Constitutional: She is oriented to person, place, and time. She appears well-developed and well-nourished. No distress.  HENT:  Head: Normocephalic and atraumatic.  Mouth/Throat: Oropharynx is clear and moist. No oropharyngeal exudate.  Eyes: Conjunctivae normal are normal.  Neck: Neck supple. No thyromegaly present.  Cardiovascular: Normal heart sounds.   Pulmonary/Chest: Breath sounds normal.  Abdominal: Soft. Bowel sounds are normal. She exhibits no distension and no mass. There is no rebound and no guarding.       tenderness with deep palpation over suprapubic area. No CVT  Genitourinary: Uterus normal. There is no rash on the right labia. There is no rash on the left labia. Cervix exhibits no motion tenderness. Right adnexum displays no mass, no tenderness and no fullness. Left adnexum displays no mass, no tenderness and no fullness. There is bleeding around the vagina. No  vaginal discharge found.  Neurological: She is alert and oriented to person, place, and time.  Skin: No rash noted.    ED Course  Procedures (including critical care time)  Labs Reviewed  POCT URINALYSIS DIP (DEVICE) - Abnormal; Notable for the following:    Nitrite POSITIVE (*)     All other components within normal limits  POCT PREGNANCY, URINE  CERVICOVAGINAL ANCILLARY ONLY   No results found.   1. UTI (lower urinary tract infection)   2. Menstrual cramps       MDM  21 year old female with history of recurrent Escherichia coli related UTI and kidney stones here with low abdominal cramping and low back pain. She's on day one of her menstrual period just evidenced on examination. Negative pregnancy test. Ovaries not enlarged or palpable. No rebound or other findings suggestive of acute abdomen. Had GC and Chlamydia pending. Nitrates positive findings in your in suggestive of possible UTI. Urine culture pending. Afebrile. Treated with Cipro for 7 days. Prescribed Norco and ibuprofen. Had ibuprofen 800 mg administered here x1 oral. Continue birth control pills as previously prescribed. Asked to followup at the emergency department if vomiting or worsening symptoms despite following treatment. urology refferal as needed.        Sharin Grave, MD 11/04/12 [phone]

## 2012-11-05 LAB — URINE CULTURE: Colony Count: >=100000

## 2012-11-05 NOTE — ED Notes (Signed)
GC/Chlamydia neg., Urine culture:>100,000 colonies E. Coli. Pt. adequately treated with Cipro. Teresa Gamble 11/05/2012

## 2012-12-26 ENCOUNTER — Encounter (HOSPITAL_BASED_OUTPATIENT_CLINIC_OR_DEPARTMENT_OTHER): Payer: Self-pay | Admitting: *Deleted

## 2012-12-26 ENCOUNTER — Emergency Department (HOSPITAL_BASED_OUTPATIENT_CLINIC_OR_DEPARTMENT_OTHER)
Admission: EM | Admit: 2012-12-26 | Discharge: 2012-12-27 | Disposition: A | Discharge status: Home / Self Care | Payer: Self-pay | Attending: Emergency Medicine | Admitting: Emergency Medicine

## 2012-12-26 DIAGNOSIS — M545 Low back pain, unspecified: Secondary | ICD-10-CM | POA: Insufficient documentation

## 2012-12-26 DIAGNOSIS — Z87891 Personal history of nicotine dependence: Secondary | ICD-10-CM | POA: Insufficient documentation

## 2012-12-26 DIAGNOSIS — M549 Dorsalgia, unspecified: Secondary | ICD-10-CM

## 2012-12-26 DIAGNOSIS — Z87442 Personal history of urinary calculi: Secondary | ICD-10-CM | POA: Insufficient documentation

## 2012-12-26 DIAGNOSIS — Z87440 Personal history of urinary (tract) infections: Secondary | ICD-10-CM | POA: Insufficient documentation

## 2012-12-26 DIAGNOSIS — Z3202 Encounter for pregnancy test, result negative: Secondary | ICD-10-CM | POA: Insufficient documentation

## 2012-12-26 DIAGNOSIS — Z79899 Other long term (current) drug therapy: Secondary | ICD-10-CM | POA: Insufficient documentation

## 2012-12-26 LAB — URINE MICROSCOPIC-ADD ON

## 2012-12-26 LAB — PREGNANCY, URINE: Preg Test, Ur: NEGATIVE

## 2012-12-26 LAB — URINALYSIS, ROUTINE W REFLEX MICROSCOPIC
Glucose, UA: NEGATIVE mg/dL
Hgb urine dipstick: NEGATIVE
Protein, ur: NEGATIVE mg/dL
pH: 6.5 (ref 5.0–8.0)

## 2012-12-26 MED ORDER — OXYCODONE-ACETAMINOPHEN 5-325 MG PO TABS: 1.0000 | ORAL_TABLET | Freq: Four times a day (QID) | ORAL | Status: DC | PRN

## 2012-12-26 MED ORDER — ONDANSETRON 4 MG PO TBDP
4.0000 mg | ORAL_TABLET | Freq: Once | ORAL | Status: AC
  Administered 2012-12-26: 4 mg via ORAL
  Filled 2012-12-26: qty 1

## 2012-12-26 MED ORDER — OXYCODONE-ACETAMINOPHEN 5-325 MG PO TABS
2.0000 | ORAL_TABLET | Freq: Once | ORAL | Status: AC
  Administered 2012-12-26: 2 via ORAL
  Filled 2012-12-26 (×2): qty 2

## 2012-12-26 MED ORDER — ONDANSETRON HCL 4 MG PO TABS: 4.0000 mg | ORAL_TABLET | Freq: Three times a day (TID) | ORAL | Status: DC | PRN

## 2012-12-26 NOTE — ED Provider Notes (Signed)
History     CSN: [id number]  Arrival date & time 12/26/12  2303   First MD Initiated Contact with Patient 12/26/12 2303      Chief Complaint  Patient presents with  . Flank Pain    (Consider location/radiation/quality/duration/timing/severity/associated sxs/prior treatment) HPI Pt reports history of UTI and kidney stones, she has had 2 days of moderate aching bilateral but R>L lower back pain, nausea, vomiting and dysuria. She reports temp of 25F yesterday. She was treated for UTI about 6 weeks ago, culture showed pan-sensisitive E.coli at that time. No vaginal bleeding or discharge.   Past Medical History  Diagnosis Date  . Kidney stones     History reviewed. No pertinent past surgical history.  No family history on file.  History  Substance Use Topics  . Smoking status: Former Smoker -- 0.5 packs/day for 5 years  . Smokeless tobacco: Not on file  . Alcohol Use: Yes     Comment: occasional     OB History    Grav Para Term Preterm Abortions TAB SAB Ect Mult Living                  Review of Systems All other systems reviewed and are negative except as noted in HPI.   Allergies  Shellfish allergy  Home Medications   Current Outpatient Rx  Name  Route  Sig  Dispense  Refill  . ACETAMINOPHEN 500 MG PO TABS   Oral   Take 1,000 mg by mouth every 6 (six) hours as needed. For pain         . CIPROFLOXACIN HCL 500 MG PO TABS   Oral   Take 1 tablet (500 mg total) by mouth every 12 (twelve) hours.   14 tablet   0   . CYANOCOBALAMIN 500 MCG PO TABS   Oral   Take 500 mcg by mouth daily.         Marland Kitchen HYDROCODONE-ACETAMINOPHEN 5-325 MG PO TABS   Oral   Take 2 tablets by mouth every 6 (six) hours as needed for pain.   10 tablet   0   . IBUPROFEN 600 MG PO TABS   Oral   Take 1 tablet (600 mg total) by mouth every 8 (eight) hours as needed for pain.   20 tablet   0   . ADULT MULTIVITAMIN W/MINERALS CH   Oral   Take 1 tablet by mouth daily.         Marland Kitchen  NORGESTIM-ETH ESTRAD TRIPHASIC 0.18/0.215/0.25 MG-35 MCG PO TABS   Oral   Take 1 tablet by mouth daily.         Marland Kitchen PROMETHAZINE HCL 25 MG PO TABS   Oral   Take 1 tablet (25 mg total) by mouth every 8 (eight) hours as needed for nausea.   10 tablet   0     BP 120/76  Pulse 75  Temp 98 F (36.7 C) (Oral)  Resp 18  Ht 5\' 3"  (1.6 m)  Wt 155 lb (70.308 kg)  BMI 27.46 kg/m2  SpO2 100%  LMP 11/30/2012  Physical Exam  Nursing note and vitals reviewed. Constitutional: She is oriented to person, place, and time. She appears well-developed and well-nourished.  HENT:  Head: Normocephalic and atraumatic.  Eyes: EOM are normal. Pupils are equal, round, and reactive to light.  Neck: Normal range of motion. Neck supple.  Cardiovascular: Normal rate, normal heart sounds and intact distal pulses.   Pulmonary/Chest: Effort normal and breath sounds  normal.  Abdominal: Bowel sounds are normal. She exhibits no distension. There is no tenderness. There is no rebound and no guarding.  Genitourinary:       No CVA tenderness  Musculoskeletal: Normal range of motion. She exhibits tenderness (lumbar paraspinal tenderness). She exhibits no edema.  Neurological: She is alert and oriented to person, place, and time. She has normal strength. No cranial nerve deficit or sensory deficit.  Skin: Skin is warm and dry. No rash noted.  Psychiatric: She has a normal mood and affect.    ED Course  Procedures (including critical care time)  Labs Reviewed  URINALYSIS, ROUTINE W REFLEX MICROSCOPIC - Abnormal; Notable for the following:    APPearance CLOUDY (*)     Leukocytes, UA TRACE (*)     All other components within normal limits  URINE MICROSCOPIC-ADD ON - Abnormal; Notable for the following:    Squamous Epithelial / LPF MANY (*)     Bacteria, UA FEW (*)     All other components within normal limits  PREGNANCY, URINE   No results found.   No diagnosis found.    MDM  UA neg for blood or  infection. Exam consistent with musculoskeletal back pain. No Red Flags. Feeling better with PO meds. Advised PCP follow up if worsening.         Teresa Gamble B. Bernette Mayers, MD 12/26/12 2355

## 2012-12-26 NOTE — ED Notes (Signed)
C/o bilateral lower flank pain but states  right flank pain that started 2 days ago that is getting progressively worse per pt. Fever last night per pt. C/o chills. C/o n/v.  Hx of UTI's and kidney stones as well.

## 2013-02-16 ENCOUNTER — Encounter (HOSPITAL_COMMUNITY): Payer: Self-pay | Admitting: Emergency Medicine

## 2013-02-16 ENCOUNTER — Emergency Department (HOSPITAL_COMMUNITY)
Admission: EM | Admit: 2013-02-16 | Discharge: 2013-02-17 | Disposition: A | Discharge status: Home / Self Care | Payer: Self-pay | Attending: Emergency Medicine | Admitting: Emergency Medicine

## 2013-02-16 DIAGNOSIS — Z79899 Other long term (current) drug therapy: Secondary | ICD-10-CM | POA: Insufficient documentation

## 2013-02-16 DIAGNOSIS — N390 Urinary tract infection, site not specified: Secondary | ICD-10-CM | POA: Insufficient documentation

## 2013-02-16 DIAGNOSIS — Z8742 Personal history of other diseases of the female genital tract: Secondary | ICD-10-CM | POA: Insufficient documentation

## 2013-02-16 DIAGNOSIS — R109 Unspecified abdominal pain: Secondary | ICD-10-CM | POA: Insufficient documentation

## 2013-02-16 DIAGNOSIS — Z3202 Encounter for pregnancy test, result negative: Secondary | ICD-10-CM | POA: Insufficient documentation

## 2013-02-16 DIAGNOSIS — Z87891 Personal history of nicotine dependence: Secondary | ICD-10-CM | POA: Insufficient documentation

## 2013-02-16 DIAGNOSIS — Z87442 Personal history of urinary calculi: Secondary | ICD-10-CM | POA: Insufficient documentation

## 2013-02-16 HISTORY — DX: Unspecified ovarian cyst, unspecified side: N83.209

## 2013-02-16 HISTORY — DX: Calculus of kidney: N20.0

## 2013-02-16 NOTE — ED Notes (Signed)
PT. REPORTS LOW BACK PAIN AND LOW ABDOMINAL PAIN WITH EMESIS ONSET THIS EVENING , STATES HISTORY OF KIDNEY STONES AND OVARIAN CYST, DENIES DYSURIA OR HEMATURIA.

## 2013-02-17 ENCOUNTER — Emergency Department (HOSPITAL_COMMUNITY): Payer: Self-pay

## 2013-02-17 ENCOUNTER — Encounter (HOSPITAL_COMMUNITY): Payer: Self-pay | Admitting: Radiology

## 2013-02-17 LAB — COMPREHENSIVE METABOLIC PANEL
ALT: 18 U/L (ref 0–35)
Albumin: 4.1 g/dL (ref 3.5–5.2)
Alkaline Phosphatase: 72 U/L (ref 39–117)
Calcium: 9.6 mg/dL (ref 8.4–10.5)
GFR calc Af Amer: >90 mL/min (ref >90)
Glucose, Bld: 94 mg/dL (ref 70–99)
Potassium: 4.2 mEq/L (ref 3.5–5.1)
Sodium: 137 mEq/L (ref 135–145)
Total Protein: 7.2 g/dL (ref 6.0–8.3)

## 2013-02-17 LAB — CBC WITH DIFFERENTIAL/PLATELET
Basophils Relative: 0 % (ref 0–1)
Eosinophils Absolute: 0.4 10*3/uL (ref 0.0–0.7)
Eosinophils Relative: 3 % (ref 0–5)
MCH: 30.6 pg (ref 26.0–34.0)
MCHC: 34.4 g/dL (ref 30.0–36.0)
MCV: 89.1 fL (ref 78.0–100.0)
Neutrophils Relative %: 69 % (ref 43–77)
Platelets: 198 10*3/uL (ref 150–400)
RDW: 12.7 % (ref 11.5–15.5)

## 2013-02-17 LAB — URINALYSIS, ROUTINE W REFLEX MICROSCOPIC
Bilirubin Urine: NEGATIVE
Ketones, ur: NEGATIVE mg/dL
Nitrite: NEGATIVE
Protein, ur: NEGATIVE mg/dL
Specific Gravity, Urine: 1.024 (ref 1.005–1.030)
Urobilinogen, UA: 0.2 mg/dL (ref 0.0–1.0)

## 2013-02-17 LAB — URINE MICROSCOPIC-ADD ON

## 2013-02-17 LAB — POCT PREGNANCY, URINE: Preg Test, Ur: NEGATIVE

## 2013-02-17 MED ORDER — MORPHINE SULFATE 4 MG/ML IJ SOLN
4.0000 mg | Freq: Once | INTRAMUSCULAR | Status: AC
  Administered 2013-02-17: 4 mg via INTRAVENOUS
  Filled 2013-02-17: qty 1

## 2013-02-17 MED ORDER — ONDANSETRON HCL 4 MG/2ML IJ SOLN
4.0000 mg | Freq: Once | INTRAMUSCULAR | Status: AC
  Administered 2013-02-17: 4 mg via INTRAVENOUS
  Filled 2013-02-17: qty 2

## 2013-02-17 MED ORDER — SODIUM CHLORIDE 0.9 % IV BOLUS (SEPSIS)
1000.0000 mL | Freq: Once | INTRAVENOUS | Status: AC
  Administered 2013-02-17: 1000 mL via INTRAVENOUS

## 2013-02-17 MED ORDER — ONDANSETRON HCL 4 MG PO TABS: 4.0000 mg | ORAL_TABLET | Freq: Four times a day (QID) | ORAL | Status: DC

## 2013-02-17 MED ORDER — CEPHALEXIN 500 MG PO CAPS: 500.0000 mg | ORAL_CAPSULE | Freq: Four times a day (QID) | ORAL | Status: DC

## 2013-02-17 MED ORDER — DEXTROSE 5 % IV SOLN
1.0000 g | Freq: Once | INTRAVENOUS | Status: AC
  Administered 2013-02-17: 1 g via INTRAVENOUS
  Filled 2013-02-17: qty 10

## 2013-02-17 MED ORDER — TRAMADOL HCL 50 MG PO TABS: 50.0000 mg | ORAL_TABLET | Freq: Four times a day (QID) | ORAL | Status: DC | PRN

## 2013-02-17 MED ORDER — FLUCONAZOLE 200 MG PO TABS: 200.0000 mg | ORAL_TABLET | Freq: Every day | ORAL | Status: AC

## 2013-02-17 NOTE — ED Provider Notes (Addendum)
History     CSN: [id number]  Arrival date & time 02/16/13  2345   First MD Initiated Contact with Patient 02/16/13 2352      Chief Complaint  Patient presents with  . Back Pain    (Consider location/radiation/quality/duration/timing/severity/associated sxs/prior treatment) Patient is a 22 y.o. female presenting with back pain. The history is provided by the patient.  Back Pain Pain location: left flank. Quality:  Aching and stabbing Radiates to:  Does not radiate Pain severity:  Severe Onset quality:  Sudden Duration:  4 hours Timing:  Constant Progression:  Unchanged Chronicity:  Recurrent Relieved by:  Nothing Worsened by:  Nothing tried Ineffective treatments:  None tried Associated symptoms: abdominal pain and dysuria   Abdominal pain:    Location:  L flank   Severity:  Severe   Onset quality:  Sudden   Timing:  Constant   Chronicity:  Recurrent   Past Medical History  Diagnosis Date  . Renal stones   . Ovarian cyst     History reviewed. No pertinent past surgical history.  No family history on file.  History  Substance Use Topics  . Smoking status: Former Smoker -- 0.50 packs/day for 5 years  . Smokeless tobacco: Not on file  . Alcohol Use: Yes     Comment: occasional     OB History   Grav Para Term Preterm Abortions TAB SAB Ect Mult Living                  Review of Systems  Gastrointestinal: Positive for abdominal pain.  Genitourinary: Positive for dysuria.  Musculoskeletal: Positive for back pain.  All other systems reviewed and are negative.    Allergies  Shellfish allergy and Phenergan  Home Medications   Current Outpatient Rx  Name  Route  Sig  Dispense  Refill  . acetaminophen (TYLENOL) 500 MG tablet   Oral   Take 1,000 mg by mouth every 6 (six) hours as needed. For pain         . ciprofloxacin (CIPRO) 500 MG tablet   Oral   Take 1 tablet (500 mg total) by mouth every 12 (twelve) hours.   14 tablet   0   .  cyanocobalamin 500 MCG tablet   Oral   Take 500 mcg by mouth daily.         Marland Kitchen HYDROcodone-acetaminophen (NORCO/VICODIN) 5-325 MG per tablet   Oral   Take 2 tablets by mouth every 6 (six) hours as needed for pain.   10 tablet   0   . ibuprofen (ADVIL,MOTRIN) 600 MG tablet   Oral   Take 1 tablet (600 mg total) by mouth every 8 (eight) hours as needed for pain.   20 tablet   0   . Multiple Vitamin (MULTIVITAMIN WITH MINERALS) TABS   Oral   Take 1 tablet by mouth daily.         . Norgestimate-Ethinyl Estradiol Triphasic (TRI-SPRINTEC) 0.18/0.215/0.25 MG-35 MCG tablet   Oral   Take 1 tablet by mouth daily.         . ondansetron (ZOFRAN) 4 MG tablet   Oral   Take 1 tablet (4 mg total) by mouth every 8 (eight) hours as needed for nausea.   12 tablet   0   . oxyCODONE-acetaminophen (PERCOCET/ROXICET) 5-325 MG per tablet   Oral   Take 1-2 tablets by mouth every 6 (six) hours as needed for pain.   20 tablet   0   .  promethazine (PHENERGAN) 25 MG tablet   Oral   Take 1 tablet (25 mg total) by mouth every 8 (eight) hours as needed for nausea.   10 tablet   0     BP 125/83  Pulse 89  Temp(Src) 98.9 F (37.2 C) (Oral)  Resp 16  SpO2 98%  LMP 02/01/2013  Physical Exam  Constitutional: She is oriented to person, place, and time. She appears well-developed and well-nourished.  HENT:  Head: Normocephalic and atraumatic.  Eyes: Conjunctivae and EOM are normal. Pupils are equal, round, and reactive to light.  Neck: Normal range of motion.  Cardiovascular: Normal rate, regular rhythm and normal heart sounds.   Pulmonary/Chest: Effort normal and breath sounds normal.  Abdominal: Soft. Bowel sounds are normal. There is tenderness.  lef tflank/suprapubic tenderness  Musculoskeletal: Normal range of motion.  Neurological: She is alert and oriented to person, place, and time.  Skin: Skin is warm and dry.  Psychiatric: She has a normal mood and affect. Her behavior is  normal.    ED Course  Procedures (including critical care time)  Labs Reviewed  URINALYSIS, ROUTINE W REFLEX MICROSCOPIC  CBC WITH DIFFERENTIAL  COMPREHENSIVE METABOLIC PANEL   No results found.   No diagnosis found.    MDM  + abd pain.  Await labs, ct,  Will ivf, analgesia, reassess     + uti,  Will treat dc.  Afeb, tol po.  Fu with pmd   Rosanne Ashing, MD 02/17/13 4098  Rosanne Ashing, MD 02/17/13 [phone]

## 2013-06-09 ENCOUNTER — Emergency Department (HOSPITAL_BASED_OUTPATIENT_CLINIC_OR_DEPARTMENT_OTHER)
Admission: EM | Admit: 2013-06-09 | Discharge: 2013-06-10 | Disposition: A | Discharge status: Home / Self Care | Payer: Self-pay | Attending: Emergency Medicine | Admitting: Emergency Medicine

## 2013-06-09 DIAGNOSIS — Z8742 Personal history of other diseases of the female genital tract: Secondary | ICD-10-CM | POA: Insufficient documentation

## 2013-06-09 DIAGNOSIS — N390 Urinary tract infection, site not specified: Secondary | ICD-10-CM | POA: Insufficient documentation

## 2013-06-09 DIAGNOSIS — K219 Gastro-esophageal reflux disease without esophagitis: Secondary | ICD-10-CM | POA: Insufficient documentation

## 2013-06-09 DIAGNOSIS — R110 Nausea: Secondary | ICD-10-CM | POA: Insufficient documentation

## 2013-06-09 DIAGNOSIS — Z3202 Encounter for pregnancy test, result negative: Secondary | ICD-10-CM | POA: Insufficient documentation

## 2013-06-09 DIAGNOSIS — R6883 Chills (without fever): Secondary | ICD-10-CM | POA: Insufficient documentation

## 2013-06-09 DIAGNOSIS — Z79899 Other long term (current) drug therapy: Secondary | ICD-10-CM | POA: Insufficient documentation

## 2013-06-09 DIAGNOSIS — M545 Low back pain, unspecified: Secondary | ICD-10-CM | POA: Insufficient documentation

## 2013-06-09 DIAGNOSIS — Z87442 Personal history of urinary calculi: Secondary | ICD-10-CM | POA: Insufficient documentation

## 2013-06-09 DIAGNOSIS — Z87891 Personal history of nicotine dependence: Secondary | ICD-10-CM | POA: Insufficient documentation

## 2013-06-09 LAB — URINE MICROSCOPIC-ADD ON

## 2013-06-09 LAB — URINALYSIS, ROUTINE W REFLEX MICROSCOPIC
Bilirubin Urine: NEGATIVE
Hgb urine dipstick: NEGATIVE
Ketones, ur: NEGATIVE mg/dL
Specific Gravity, Urine: 1.024 (ref 1.005–1.030)
Urobilinogen, UA: 0.2 mg/dL (ref 0.0–1.0)

## 2013-06-09 NOTE — ED Notes (Signed)
Pt c/o abd pain since Sunday with back pain and nausea, denies vomiting. Denies fever, but c/o chills.

## 2013-06-10 LAB — GC/CHLAMYDIA PROBE AMP
CT Probe RNA: NEGATIVE
GC Probe RNA: NEGATIVE

## 2013-06-10 LAB — WET PREP, GENITAL: Trich, Wet Prep: NONE SEEN

## 2013-06-10 MED ORDER — ONDANSETRON 8 MG PO TBDP: 8.0000 mg | ORAL_TABLET | Freq: Three times a day (TID) | ORAL | Status: DC | PRN

## 2013-06-10 MED ORDER — NITROFURANTOIN MONOHYD MACRO 100 MG PO CAPS: 100.0000 mg | ORAL_CAPSULE | Freq: Two times a day (BID) | ORAL | Status: DC

## 2013-06-10 MED ORDER — NITROFURANTOIN MONOHYD MACRO 100 MG PO CAPS
100.0000 mg | ORAL_CAPSULE | Freq: Once | ORAL | Status: AC
  Administered 2013-06-10: 100 mg via ORAL
  Filled 2013-06-10: qty 1

## 2013-06-10 MED ORDER — PHENAZOPYRIDINE HCL 200 MG PO TABS: 200.0000 mg | ORAL_TABLET | Freq: Three times a day (TID) | ORAL | Status: DC

## 2013-06-10 MED ORDER — PHENAZOPYRIDINE HCL 100 MG PO TABS
200.0000 mg | ORAL_TABLET | Freq: Once | ORAL | Status: AC
  Administered 2013-06-10: 200 mg via ORAL
  Filled 2013-06-10: qty 2

## 2013-06-10 MED ORDER — PANTOPRAZOLE SODIUM 40 MG PO TBEC
40.0000 mg | DELAYED_RELEASE_TABLET | Freq: Once | ORAL | Status: AC
  Administered 2013-06-10: 40 mg via ORAL
  Filled 2013-06-10: qty 1

## 2013-06-10 MED ORDER — LANSOPRAZOLE 30 MG PO CPDR: 30.0000 mg | DELAYED_RELEASE_CAPSULE | Freq: Every day | ORAL | Status: DC

## 2013-06-10 NOTE — ED Provider Notes (Signed)
History    CSN: [id number] Arrival date & time 06/09/13  2311  First MD Initiated Contact with Patient 06/10/13 0056     Chief Complaint  Patient presents with  . Abdominal Pain   (Consider location/radiation/quality/duration/timing/severity/associated sxs/prior Treatment) HPI 22 year old female with one-week history of pelvic discomfort and low back pain. Symptoms are moderate and are characterized is like previous urinary tract infections. Associated symptoms include of vague abdominal pain that changes position but is currently in the periumbilical region. She is also having chills, nausea but no vomiting. She denies diarrhea, vaginal bleeding, vaginal discharge. She has had acid reflux associated with the nausea. She is only taking Tylenol for the pain and comes for the acid reflux.  Past Medical History  Diagnosis Date  . Renal stones   . Ovarian cyst    No past surgical history on file. No family history on file. History  Substance Use Topics  . Smoking status: Former Smoker -- 0.50 packs/day for 5 years  . Smokeless tobacco: Not on file  . Alcohol Use: Yes     Comment: occasional    OB History   Grav Para Term Preterm Abortions TAB SAB Ect Mult Living                 Review of Systems  All other systems reviewed and are negative.    Allergies  Shellfish allergy and Phenergan  Home Medications   Current Outpatient Rx  Name  Route  Sig  Dispense  Refill  . acetaminophen (TYLENOL) 500 MG tablet   Oral   Take 1,000 mg by mouth every 6 (six) hours as needed. For pain         . cephALEXin (KEFLEX) 500 MG capsule   Oral   Take 1 capsule (500 mg total) by mouth 4 (four) times daily.   20 capsule   0   . ciprofloxacin (CIPRO) 500 MG tablet   Oral   Take 1 tablet (500 mg total) by mouth every 12 (twelve) hours.   14 tablet   0   . cyanocobalamin 500 MCG tablet   Oral   Take 500 mcg by mouth daily.         Marland Kitchen HYDROcodone-acetaminophen (NORCO/VICODIN)  5-325 MG per tablet   Oral   Take 2 tablets by mouth every 6 (six) hours as needed for pain.   10 tablet   0   . ibuprofen (ADVIL,MOTRIN) 600 MG tablet   Oral   Take 1 tablet (600 mg total) by mouth every 8 (eight) hours as needed for pain.   20 tablet   0   . Multiple Vitamin (MULTIVITAMIN WITH MINERALS) TABS   Oral   Take 1 tablet by mouth daily.         . Norgestimate-Ethinyl Estradiol Triphasic (TRI-SPRINTEC) 0.18/0.215/0.25 MG-35 MCG tablet   Oral   Take 1 tablet by mouth daily.         . ondansetron (ZOFRAN) 4 MG tablet   Oral   Take 1 tablet (4 mg total) by mouth every 8 (eight) hours as needed for nausea.   12 tablet   0   . ondansetron (ZOFRAN) 4 MG tablet   Oral   Take 1 tablet (4 mg total) by mouth every 6 (six) hours.   12 tablet   0   . oxyCODONE-acetaminophen (PERCOCET/ROXICET) 5-325 MG per tablet   Oral   Take 1-2 tablets by mouth every 6 (six) hours as needed for pain.  20 tablet   0   . promethazine (PHENERGAN) 25 MG tablet   Oral   Take 1 tablet (25 mg total) by mouth every 8 (eight) hours as needed for nausea.   10 tablet   0   . traMADol (ULTRAM) 50 MG tablet   Oral   Take 1 tablet (50 mg total) by mouth every 6 (six) hours as needed for pain.   15 tablet   0    BP 116/81  Pulse 89  Temp(Src) 98.1 F (36.7 C) (Oral)  Ht 5\' 3"  (1.6 m)  Wt 150 lb (68.04 kg)  BMI 26.58 kg/m2  SpO2 100%  LMP 05/26/2013  Physical Exam General: Well-developed, well-nourished female in no acute distress; appearance consistent with age of record HENT: normocephalic, atraumatic Eyes: pupils equal round and reactive to light; extraocular muscles intact Neck: supple Heart: regular rate and rhythm Lungs: clear to auscultation bilaterally Abdomen: soft; nondistended; mild periumbilical tenderness; no masses or hepatosplenomegaly; bowel sounds present GU: No CVA tenderness; normal external genitalia; no vaginal bleeding; ovarian discharge; no cervical  motion tenderness Extremities: No deformity; full range of motion Neurologic: Awake, alert and oriented; motor function intact in all extremities and symmetric; no facial droop Skin: Warm and dry Psychiatric: Normal mood and affect    ED Course  Procedures (including critical care time)   MDM   Nursing notes and vitals signs, including pulse oximetry, reviewed.  Summary of this visit's results, reviewed by myself:  Labs:  Results for orders placed during the hospital encounter of 06/09/13 (from the past 24 hour(s))  URINALYSIS, ROUTINE W REFLEX MICROSCOPIC     Status: Abnormal   Collection Time    06/09/13 11:30 PM      Result Value Range   Color, Urine YELLOW  YELLOW   APPearance CLEAR  CLEAR   Specific Gravity, Urine 1.024  1.005 - 1.030   pH 6.0  5.0 - 8.0   Glucose, UA NEGATIVE  NEGATIVE mg/dL   Hgb urine dipstick NEGATIVE  NEGATIVE   Bilirubin Urine NEGATIVE  NEGATIVE   Ketones, ur NEGATIVE  NEGATIVE mg/dL   Protein, ur NEGATIVE  NEGATIVE mg/dL   Urobilinogen, UA 0.2  0.0 - 1.0 mg/dL   Nitrite POSITIVE (*) NEGATIVE   Leukocytes, UA SMALL (*) NEGATIVE  PREGNANCY, URINE     Status: None   Collection Time    06/09/13 11:30 PM      Result Value Range   Preg Test, Ur NEGATIVE  NEGATIVE  URINE MICROSCOPIC-ADD ON     Status: Abnormal   Collection Time    06/09/13 11:30 PM      Result Value Range   Squamous Epithelial / LPF FEW (*) RARE   WBC, UA 7-10  <3 WBC/hpf   RBC / HPF 0-2  <3 RBC/hpf   Bacteria, UA MANY (*) RARE  WET PREP, GENITAL     Status: Abnormal   Collection Time    06/10/13  1:10 AM      Result Value Range   Yeast Wet Prep HPF POC NONE SEEN  NONE SEEN   Trich, Wet Prep NONE SEEN  NONE SEEN   Clue Cells Wet Prep HPF POC FEW (*) NONE SEEN   WBC, Wet Prep HPF POC FEW (*) NONE SEEN   We will treat for urinary tract infection and GERD.  Hanley Seamen, MD 06/10/13 0120

## 2013-06-12 LAB — URINE CULTURE: Colony Count: >=100000

## 2013-06-13 ENCOUNTER — Telehealth (HOSPITAL_COMMUNITY): Payer: Self-pay | Admitting: Emergency Medicine

## 2013-06-13 NOTE — ED Notes (Signed)
Post ED Visit - Positive Culture Follow-up  Culture report reviewed by antimicrobial stewardship pharmacist: []  Wes Dulaney, Pharm.D., BCPS []  Celedonio Miyamoto, Pharm.D., BCPS [x]  Georgina Pillion, Pharm.D., BCPS []  [place], [place].D., BCPS, AAHIVP []  Estella Husk, Pharm.D., BCPS, AAHIVP  Positive urine culture Treated with Macrobid, organism sensitive to the same and no further patient follow-up is required at this time.  Teresa Gamble 06/13/2013, 11:24 AM

## 2013-09-08 ENCOUNTER — Emergency Department (HOSPITAL_BASED_OUTPATIENT_CLINIC_OR_DEPARTMENT_OTHER)
Admission: EM | Admit: 2013-09-08 | Discharge: 2013-09-08 | Disposition: A | Discharge status: Home / Self Care | Payer: Self-pay | Attending: Emergency Medicine | Admitting: Emergency Medicine

## 2013-09-08 ENCOUNTER — Emergency Department (HOSPITAL_BASED_OUTPATIENT_CLINIC_OR_DEPARTMENT_OTHER): Payer: Self-pay

## 2013-09-08 ENCOUNTER — Encounter (HOSPITAL_BASED_OUTPATIENT_CLINIC_OR_DEPARTMENT_OTHER): Payer: Self-pay | Admitting: Emergency Medicine

## 2013-09-08 DIAGNOSIS — J329 Chronic sinusitis, unspecified: Secondary | ICD-10-CM | POA: Insufficient documentation

## 2013-09-08 DIAGNOSIS — Z87891 Personal history of nicotine dependence: Secondary | ICD-10-CM | POA: Insufficient documentation

## 2013-09-08 DIAGNOSIS — IMO0001 Reserved for inherently not codable concepts without codable children: Secondary | ICD-10-CM | POA: Insufficient documentation

## 2013-09-08 DIAGNOSIS — Z8742 Personal history of other diseases of the female genital tract: Secondary | ICD-10-CM | POA: Insufficient documentation

## 2013-09-08 DIAGNOSIS — Z87442 Personal history of urinary calculi: Secondary | ICD-10-CM | POA: Insufficient documentation

## 2013-09-08 MED ORDER — AMOXICILLIN-POT CLAVULANATE 875-125 MG PO TABS: 1.0000 | ORAL_TABLET | Freq: Two times a day (BID) | ORAL | Status: DC

## 2013-09-08 NOTE — ED Notes (Signed)
Pt reports head congestion, productive cough, throat pain, nasal congestion and generalized body aches x 2 weeks.  Reports fevers.

## 2013-09-08 NOTE — Discharge Instructions (Signed)
Take Augmentin as directed until gone. Refer to attached documents for more information. Return to the ED with worsening or concerning symptoms.  °

## 2013-09-08 NOTE — ED Provider Notes (Signed)
Medical screening examination/treatment/procedure(s) were performed by non-physician practitioner and as supervising physician I was immediately available for consultation/collaboration.   Charles B. Sheldon, MD 09/08/13 2230 

## 2013-09-08 NOTE — ED Provider Notes (Signed)
CSN: [id number]     Arrival date & time 09/08/13  1606 History   First MD Initiated Contact with Patient 09/08/13 1615     Chief Complaint  Patient presents with  . URI   (Consider location/radiation/quality/duration/timing/severity/associated sxs/prior Treatment) HPI Comments: Patient is a 22 year old female who presents with multiple complaints including head congestion, productive cough, sore throat and body aches for 2 weeks. Symptoms started gradually and progressively worsened since the onset. Patient has tried OTC medications without relief. No aggravating/alleviating factors. No other associated symptoms.   Patient is a 22 y.o. female presenting with URI.  URI Presenting symptoms: cough and sore throat   Associated symptoms: myalgias     Past Medical History  Diagnosis Date  . Renal stones   . Ovarian cyst    History reviewed. No pertinent past surgical history. History reviewed. No pertinent family history. History  Substance Use Topics  . Smoking status: Former Smoker -- 0.50 packs/day for 5 years  . Smokeless tobacco: Not on file  . Alcohol Use: Yes     Comment: occasional    OB History   Grav Para Term Preterm Abortions TAB SAB Ect Mult Living                 Review of Systems  HENT: Positive for sinus pressure and sore throat.   Respiratory: Positive for cough.   Musculoskeletal: Positive for myalgias.  All other systems reviewed and are negative.    Allergies  Shellfish allergy and Phenergan  Home Medications  No current outpatient prescriptions on file. BP 125/74  Pulse 80  Temp(Src) 98.1 F (36.7 C) (Oral)  Resp 18  Ht 5\' 2"  (1.575 m)  Wt 155 lb (70.308 kg)  BMI 28.34 kg/m2  SpO2 99%  LMP 08/20/2013 Physical Exam  Nursing note and vitals reviewed. Constitutional: She is oriented to person, place, and time. She appears well-developed and well-nourished. No distress.  HENT:  Head: Normocephalic and atraumatic.  Right Ear: External ear  normal.  Left Ear: External ear normal.  Mouth/Throat: Oropharynx is clear and moist. No oropharyngeal exudate.  Maxillary sinus tenderness to palpation. Bilateral TM visualized and intact. No mastoid tenderness to palpation or tenderness with auricle retraction.   Eyes: Conjunctivae and EOM are normal. Pupils are equal, round, and reactive to light.  Neck: Normal range of motion.  Cardiovascular: Normal rate and regular rhythm.  Exam reveals no gallop and no friction rub.   No murmur heard. Pulmonary/Chest: Effort normal and breath sounds normal. She has no wheezes. She has no rales. She exhibits no tenderness.  Abdominal: Soft. She exhibits no distension. There is no tenderness. There is no rebound and no guarding.  Musculoskeletal: Normal range of motion.  Lymphadenopathy:    She has cervical adenopathy.  Neurological: She is alert and oriented to person, place, and time. Coordination normal.  Speech is goal-oriented. Moves limbs without ataxia.   Skin: Skin is warm and dry.  Psychiatric: She has a normal mood and affect. Her behavior is normal.    ED Course  Procedures (including critical care time) Labs Review Labs Reviewed  RAPID STREP SCREEN  CULTURE, GROUP A STREP   Imaging Review Dg Chest 2 View  09/08/2013   CLINICAL DATA:  Cough and congestion  EXAM: CHEST  2 VIEW  COMPARISON:  None.  FINDINGS: The heart size and mediastinal contours are within normal limits. Both lungs are clear. The visualized skeletal structures are unremarkable.  IMPRESSION: No active cardiopulmonary  disease.   Electronically Signed   By: Alcide Clever M.D.   On: 09/08/2013 16:33    EKG Interpretation   None       MDM   1. Sinusitis     5:04 PM Chest xray and rapid strep unremarkable. Vitals stable and patient afebrile. Patient will be treated for sinusitis with Augmentin. No further evaluation needed at this time.    Emilia Beck, PA-C 09/08/13 [phone]

## 2014-01-31 ENCOUNTER — Emergency Department (HOSPITAL_COMMUNITY)
Admission: EM | Admit: 2014-01-31 | Discharge: 2014-01-31 | Disposition: A | Discharge status: Home / Self Care | Payer: Self-pay | Attending: Emergency Medicine | Admitting: Emergency Medicine

## 2014-01-31 ENCOUNTER — Encounter (HOSPITAL_COMMUNITY): Payer: Self-pay | Admitting: Emergency Medicine

## 2014-01-31 ENCOUNTER — Emergency Department (HOSPITAL_COMMUNITY): Payer: Self-pay

## 2014-01-31 DIAGNOSIS — M545 Low back pain, unspecified: Secondary | ICD-10-CM | POA: Insufficient documentation

## 2014-01-31 DIAGNOSIS — R109 Unspecified abdominal pain: Secondary | ICD-10-CM | POA: Insufficient documentation

## 2014-01-31 DIAGNOSIS — R509 Fever, unspecified: Secondary | ICD-10-CM | POA: Insufficient documentation

## 2014-01-31 DIAGNOSIS — Z8742 Personal history of other diseases of the female genital tract: Secondary | ICD-10-CM | POA: Insufficient documentation

## 2014-01-31 DIAGNOSIS — R112 Nausea with vomiting, unspecified: Secondary | ICD-10-CM | POA: Insufficient documentation

## 2014-01-31 DIAGNOSIS — N898 Other specified noninflammatory disorders of vagina: Secondary | ICD-10-CM | POA: Insufficient documentation

## 2014-01-31 DIAGNOSIS — Z87448 Personal history of other diseases of urinary system: Secondary | ICD-10-CM | POA: Insufficient documentation

## 2014-01-31 DIAGNOSIS — Z87442 Personal history of urinary calculi: Secondary | ICD-10-CM | POA: Insufficient documentation

## 2014-01-31 DIAGNOSIS — Z87891 Personal history of nicotine dependence: Secondary | ICD-10-CM | POA: Insufficient documentation

## 2014-01-31 DIAGNOSIS — Z3202 Encounter for pregnancy test, result negative: Secondary | ICD-10-CM | POA: Insufficient documentation

## 2014-01-31 DIAGNOSIS — R300 Dysuria: Secondary | ICD-10-CM | POA: Insufficient documentation

## 2014-01-31 DIAGNOSIS — Z79899 Other long term (current) drug therapy: Secondary | ICD-10-CM | POA: Insufficient documentation

## 2014-01-31 LAB — URINALYSIS, ROUTINE W REFLEX MICROSCOPIC
Bilirubin Urine: NEGATIVE
Glucose, UA: NEGATIVE mg/dL
Hgb urine dipstick: NEGATIVE
Ketones, ur: NEGATIVE mg/dL
Leukocytes, UA: NEGATIVE
Nitrite: NEGATIVE
Protein, ur: NEGATIVE mg/dL
Specific Gravity, Urine: 1.022 (ref 1.005–1.030)
Urobilinogen, UA: 0.2 mg/dL (ref 0.0–1.0)
pH: 8 (ref 5.0–8.0)

## 2014-01-31 LAB — CBC WITH DIFFERENTIAL/PLATELET
Basophils Absolute: 0 K/uL (ref 0.0–0.1)
Basophils Relative: 0 % (ref 0–1)
Eosinophils Absolute: 0.3 K/uL (ref 0.0–0.7)
Eosinophils Relative: 4 % (ref 0–5)
HCT: 43.4 % (ref 36.0–46.0)
Hemoglobin: 14.7 g/dL (ref 12.0–15.0)
Lymphocytes Relative: 19 % (ref 12–46)
Lymphs Abs: 1.3 10*3/uL (ref 0.7–4.0)
MCH: 30.8 pg (ref 26.0–34.0)
MCHC: 33.9 g/dL (ref 30.0–36.0)
MCV: 91 fL (ref 78.0–100.0)
Monocytes Absolute: 0.4 10*3/uL (ref 0.1–1.0)
Monocytes Relative: 6 % (ref 3–12)
Neutro Abs: 5.1 K/uL (ref 1.7–7.7)
Neutrophils Relative %: 72 % (ref 43–77)
Platelets: 219 K/uL (ref 150–400)
RBC: 4.77 MIL/uL (ref 3.87–5.11)
RDW: 13.1 % (ref 11.5–15.5)
WBC: 7.2 10*3/uL (ref 4.0–10.5)

## 2014-01-31 LAB — WET PREP, GENITAL
Trich, Wet Prep: NONE SEEN
Yeast Wet Prep HPF POC: NONE SEEN

## 2014-01-31 LAB — COMPREHENSIVE METABOLIC PANEL
ALT: 12 U/L (ref 0–35)
BUN: 15 mg/dL (ref 6–23)
CO2: 24 mEq/L (ref 19–32)
Calcium: 9.1 mg/dL (ref 8.4–10.5)
Creatinine, Ser: 0.76 mg/dL (ref 0.50–1.10)
GFR calc Af Amer: >90 mL/min (ref >90)
GFR calc non Af Amer: >90 mL/min (ref >90)
Glucose, Bld: 91 mg/dL (ref 70–99)

## 2014-01-31 LAB — URINE MICROSCOPIC-ADD ON

## 2014-01-31 LAB — COMPREHENSIVE METABOLIC PANEL WITH GFR
AST: 16 U/L (ref 0–37)
Albumin: 4.1 g/dL (ref 3.5–5.2)
Alkaline Phosphatase: 66 U/L (ref 39–117)
Chloride: 106 meq/L (ref 96–112)
Potassium: 4.3 meq/L (ref 3.7–5.3)
Sodium: 141 meq/L (ref 137–147)
Total Bilirubin: 0.5 mg/dL (ref 0.3–1.2)
Total Protein: 7.1 g/dL (ref 6.0–8.3)

## 2014-01-31 LAB — PREGNANCY, URINE: Preg Test, Ur: NEGATIVE

## 2014-01-31 MED ORDER — DIPHENHYDRAMINE HCL 25 MG PO CAPS
25.0000 mg | ORAL_CAPSULE | Freq: Once | ORAL | Status: DC
  Filled 2014-01-31: qty 1

## 2014-01-31 MED ORDER — ONDANSETRON 4 MG PO TBDP
8.0000 mg | ORAL_TABLET | Freq: Once | ORAL | Status: AC
  Administered 2014-01-31: 8 mg via ORAL
  Filled 2014-01-31: qty 2

## 2014-01-31 MED ORDER — ONDANSETRON HCL 4 MG/2ML IJ SOLN
4.0000 mg | Freq: Once | INTRAMUSCULAR | Status: AC
  Administered 2014-01-31: 4 mg via INTRAVENOUS
  Filled 2014-01-31: qty 2

## 2014-01-31 MED ORDER — SODIUM CHLORIDE 0.9 % IV BOLUS (SEPSIS)
1000.0000 mL | Freq: Once | INTRAVENOUS | Status: AC
  Administered 2014-01-31: 1000 mL via INTRAVENOUS

## 2014-01-31 MED ORDER — MORPHINE SULFATE 4 MG/ML IJ SOLN
4.0000 mg | Freq: Once | INTRAMUSCULAR | Status: AC
  Administered 2014-01-31: 4 mg via INTRAVENOUS
  Filled 2014-01-31: qty 1

## 2014-01-31 MED ORDER — IOHEXOL 300 MG/ML  SOLN
25.0000 mL | INTRAMUSCULAR | Status: AC
  Administered 2014-01-31: 25 mL via ORAL

## 2014-01-31 MED ORDER — IOHEXOL 300 MG/ML  SOLN
100.0000 mL | Freq: Once | INTRAMUSCULAR | Status: AC | PRN
  Administered 2014-01-31: 100 mL via INTRAVENOUS

## 2014-01-31 MED ORDER — ONDANSETRON 8 MG PO TBDP: ORAL_TABLET | ORAL | Status: DC

## 2014-01-31 MED ORDER — LORAZEPAM 2 MG/ML IJ SOLN
1.0000 mg | Freq: Once | INTRAMUSCULAR | Status: AC
  Administered 2014-01-31: 1 mg via INTRAVENOUS
  Filled 2014-01-31: qty 1

## 2014-01-31 MED ORDER — METOCLOPRAMIDE HCL 5 MG/ML IJ SOLN
10.0000 mg | Freq: Once | INTRAMUSCULAR | Status: AC
  Administered 2014-01-31: 10 mg via INTRAVENOUS
  Filled 2014-01-31: qty 2

## 2014-01-31 NOTE — ED Provider Notes (Signed)
Medical screening examination/treatment/procedure(s) were performed by non-physician practitioner and as supervising physician I was immediately available for consultation/collaboration.   EKG Interpretation None        Teresa PoundMichael Y. Amer Alcindor, MD 01/31/14 2241

## 2014-01-31 NOTE — Discharge Instructions (Signed)
1. Medications: zofran, usual home medications 2. Treatment: rest, drink plenty of fluids, advance diet slowly 3. Follow Up: Please followup with your primary doctor for discussion of your diagnoses and further evaluation after today's visit; if you do not have a primary care doctor use the resource guide provided to find one;    Viral Gastroenteritis Viral gastroenteritis is also known as stomach flu. This condition affects the stomach and intestinal tract. It can cause sudden diarrhea and vomiting. The illness typically lasts 3 to 8 days. Most people develop an immune response that eventually gets rid of the virus. While this natural response develops, the virus can make you quite ill. CAUSES  Many different viruses can cause gastroenteritis, such as rotavirus or noroviruses. You can catch one of these viruses by consuming contaminated food or water. You may also catch a virus by sharing utensils or other personal items with an infected person or by touching a contaminated surface. SYMPTOMS  The most common symptoms are diarrhea and vomiting. These problems can cause a severe loss of body fluids (dehydration) and a body salt (electrolyte) imbalance. Other symptoms may include:  Fever.  Headache.  Fatigue.  Abdominal pain. DIAGNOSIS  Your caregiver can usually diagnose viral gastroenteritis based on your symptoms and a physical exam. A stool sample may also be taken to test for the presence of viruses or other infections. TREATMENT  This illness typically goes away on its own. Treatments are aimed at rehydration. The most serious cases of viral gastroenteritis involve vomiting so severely that you are not able to keep fluids down. In these cases, fluids must be given through an intravenous line (IV). HOME CARE INSTRUCTIONS   Drink enough fluids to keep your urine clear or pale yellow. Drink small amounts of fluids frequently and increase the amounts as tolerated.  Ask your caregiver for  specific rehydration instructions.  Avoid:  Foods high in sugar.  Alcohol.  Carbonated drinks.  Tobacco.  Juice.  Caffeine drinks.  Extremely hot or cold fluids.  Fatty, greasy foods.  Too much intake of anything at one time.  Dairy products until 24 to 48 hours after diarrhea stops.  You may consume probiotics. Probiotics are active cultures of beneficial bacteria. They may lessen the amount and number of diarrheal stools in adults. Probiotics can be found in yogurt with active cultures and in supplements.  Wash your hands well to avoid spreading the virus.  Only take over-the-counter or prescription medicines for pain, discomfort, or fever as directed by your caregiver. Do not give aspirin to children. Antidiarrheal medicines are not recommended.  Ask your caregiver if you should continue to take your regular prescribed and over-the-counter medicines.  Keep all follow-up appointments as directed by your caregiver. SEEK IMMEDIATE MEDICAL CARE IF:   You are unable to keep fluids down.  You do not urinate at least once every 6 to 8 hours.  You develop shortness of breath.  You notice blood in your stool or vomit. This may look like coffee grounds.  You have abdominal pain that increases or is concentrated in one small area (localized).  You have persistent vomiting or diarrhea.  You have a fever.  The patient is a child younger than 3 months, and he or she has a fever.  The patient is a child older than 3 months, and he or she has a fever and persistent symptoms.  The patient is a child older than 3 months, and he or she has a fever and  symptoms suddenly get worse.  The patient is a baby, and he or she has no tears when crying. MAKE SURE YOU:   Understand these instructions.  Will watch your condition.  Will get help right away if you are not doing well or get worse. Document Released: 11/11/2005 Document Revised: 02/03/2012 Document Reviewed:  08/28/2011 [facility] Patient Information 2014 [place], [place].   Emergency Department Resource Guide 1) Find a Doctor and Pay Out of Pocket Although you won't have to find out who is covered by your insurance plan, it is a good idea to ask around and get recommendations. You will then need to call the office and see if the doctor you have chosen will accept you as a new patient and what types of options they offer for patients who are self-pay. Some doctors offer discounts or will set up payment plans for their patients who do not have insurance, but you will need to ask so you aren't surprised when you get to your appointment.  2) Contact Your Local Health Department Not all health departments have doctors that can see patients for sick visits, but many do, so it is worth a call to see if yours does. If you don't know where your local health department is, you can check in your phone book. The CDC also has a tool to help you locate your state's health department, and many state websites also have listings of all of their local health departments.  3) Find a Walk-in Clinic If your illness is not likely to be very severe or complicated, you may want to try a walk in clinic. These are popping up all over the country in pharmacies, drugstores, and shopping centers. They're usually staffed by nurse practitioners or physician assistants that have been trained to treat common illnesses and complaints. They're usually fairly quick and inexpensive. However, if you have serious medical issues or chronic medical problems, these are probably not your best option.  No Primary Care Doctor: - Call Health Connect at  [phone] - they can help you locate a primary care doctor that  accepts your insurance, provides certain services, etc. - Physician Referral Service- [phone]  Chronic Pain Problems: Organization         Address  Phone   Notes  [facility]  [phone] Patients need to  be referred by their primary care doctor.   Medication Assistance: Organization         Address  Phone   Notes  [facility] [address] [place], [place] [address] [phone]) [phone] --Must be a resident of [facility] -- Must have NO insurance coverage whatsoever (no Medicaid/ Medicare, etc.) -- The pt. MUST have a primary care doctor that directs their care regularly and follows them in the community   MedAssist  [phone]) [phone]   [employer]  [phone]) [phone]    Agencies that provide inexpensive medical care: Organization         Address  Phone   Notes  [facility]  [phone]) [phone]   [facility]    [phone]   [facility] [address] [place], [place] [address] [phone]   [facility] [address], [place] [phone]) [phone]   [facility]anned Parenthood    [phone]) [phone]   [facility]    [phone]   [facility]  [address], [place]  Phone:  [phone], Fax:  [phone] Hours of Operation:  9 am - 6 pm, M-F.  Also accepts Medicaid/Medicare and self-pay.  [facility]  [address], [place] Phone: [phone], Fax: [phone]. Hours of Operation:  8:30 am - 5:30 pm, M-F.  Also accepts Medicaid and self-pay.  [facility] [place] [address] [place] Phone: [phone]   [facility] [address] Natasha Bence [place], [place] [phone], Ext. [phone] Mondays & Thursdays: 7-9 AM.  First 15 patients are seen on a first come, first serve basis.    Medicaid-accepting [facility] Providers:  Organization         Address  Phone   Notes  [facility] [address], [place] [phone] Also accepts self-pay patients.  [facility] [address] Laurell Josephs [place], [place]  [phone]   [facility] [address], [place] [phone]   [facility] [address], [place] [phone]   Renaye Rakers [address], [place]   [phone] Only accepts [place] Access [place] patients after they have their name applied to their card.   Self-Pay (no insurance) in [facility]:  Organization         Address  Phone   Notes  Sickle Cell Patients, [facility] [address] [place], [place] [phone]   [facility] [address] [place], [place] [phone]   [facility] [place]  [address], [place] [phone]   [facility]/Dr. Osei-Bonsu  [address] [place] or [address], [place] [phone] Phone number for both [place] and [place] locations is the same.  [facility] [address] [place] [phone]   [facility] [address], [place] or [address] [phone]   [facility] [address] [place] [phone], phone; [phone], fax Sees patients 1st and 3rd Saturday of every month.  Must not qualify for public or private insurance (i.e. Medicaid, Medicare, [place] Health Choice, Veterans' Benefits)  Household income should be no more than 200% of the poverty level The clinic cannot treat you if you are pregnant or think you are pregnant  Sexually transmitted diseases are not treated at the clinic.    Dental Care: Organization         Address  Phone  Notes  [facility] Department of [facility] [address] [place], [place] [phone] Accepts children up to age 63 who are enrolled in [place] or [place] Health Choice; pregnant women with a Medicaid card; and children who have applied for Medicaid or [place] Health Choice, but were declined, whose parents can  pay a reduced fee at time of service.  [facility] Department of [facility]  [address], [place] [phone] Accepts children up to age 30 who are enrolled in [place] or [place] Health Choice; pregnant women with a Medicaid card; and children who have applied for Medicaid or [place] Health Choice, but were declined, whose parents can pay a reduced fee at time of service.  [facility]  [address] [place], [place] [phone] Patients are seen by appointment  only. Walk-ins are not accepted. [facility] will see patients 25 years of age and older. Monday - Tuesday (8am-5pm) Most Wednesdays (8:30-5pm) $30 per visit, cash only  [facility]  [address], [facility] [phone] Patients are seen by appointment only. Walk-ins are not accepted. [facility] will see patients 25 years of age and older. One Wednesday Evening (Monthly: Volunteer Based).  $30 per visit, cash only  [employer] [facility]  [phone]) [phone] for adults; Children under age 154, call Graduate Pediatric Dentistry at [phone]. Children aged 234-14, please call [phone] to request a pediatric application.  Dental services are provided in all areas of dental care including fillings, crowns and bridges, complete and partial dentures, implants, gum treatment, root canals, and extractions. Preventive care is also provided. Treatment is provided to both adults and children. Patients are selected via a lottery and there is often a waiting list.   [facility] [address], [place]  [phone]) [phone] [email or web address]   [facility] [address], [place], [place] [phone], Ext. [phone] Second and Fourth Thursday of each month, opens at 6:30 AM; Clinic ends at 9 AM.  Patients are seen on a first-come first-served basis, and a limited number are seen during each clinic.   [facility]  [address], [place], [place] [phone]) [phone]   Eligibility Requirements You must have lived in [place], [place], or [place] counties for at least the last three months.   You cannot be eligible for state or federal sponsored [place] Cityhealthcare insurance, including CIGNAVeterans Administration, [place]icaid, or [employer] EntertainmentMedicare.   You generally cannot be eligible for healthcare insurance through your employer.    How to apply: Eligibility screenings are held every Tuesday and Wednesday afternoon from 1:00 pm until 4:00 pm. You do not need an appointment for the interview!  [facility] [address], [place], [place] [phone]   [facility]  [phone]   [facility]  [phone]   [facility]  [phone]    Behavioral Health Resources in the Community: Intensive Outpatient Programs Organization         Address  Phone  Notes  [facility] [address], [place], [place] [phone]   [facility] [address], [place], [place] [phone]   [facility] [address], [place], [place]  [phone]   [facility] [address],  [place], [place] [phone] or [phone]   Substance Abuse Resources Organization         Address  Phone  Notes  [facility]  [phone]   [facility]  [phone]   [facility]  [phone]   [facility]  [phone]   Residential & Outpatient Substance Abuse Program  [phone]   Psychological Services Organization         Address  Phone  Notes  [facility]  [phone]- [phone]   [facility]  [phone]- [phone]   [facility] [address], [place] [phone] or [phone]    Mobile Crisis Teams Organization         Address  Phone  Notes  [facility], Mobile Crisis Care Unit  [phone]    [facility]  [address] [place], [place] [phone]   [facility] De[facility]sch [address] [place] [phone]  Self-Help/Support Groups Organization         Address  Phone             Notes  [facility] [place] - variety of support groups  Forada Call for more information  Narcotics Anonymous (NA), [facility] [address], [employer] [place]  2 meetings at this location   Special educational needs teacher         Address  Phone  Notes  [facility] [place],    [place]  [phone]   [facility]  [address] [place] [address], [place], [place]   [place], [place] [phone] Admissions: 8am-3pm M-F  Incentives Substance [place] [address],    [place], [place] [phone]   [facility] [address] [place], [place], [place]   [facility] [address]  [place], [place]   [facility] - Intensive Outpatient [address], [place], [place]   [facility] ([address]  [place], [place] [phone] or [phone]   [facility] (RTS) [address], [place], [place] Accepts Medicaid  [facility] [place] [address]  [place] [phone] Substance Abuse/Addiction Treatment   [facility] Organization         Address  Phone  Notes  [facility]  [phone]   Domenic Schwab, PhD [address] Arlis Porta [place], [place]   [phone] or [phone]   [place] [place], [place] [phone]   [facility] [address] [place], [place] [phone] Insurance/Medicaid/sponsorship through [facility] and Families [address]                                     [place], [place] [phone] [facility] [address] [place], [place] [phone]    Dr. Adele Schilder  [phone]   [facility]. 1) [address], [place] 2) [place] 3)  [place] [address], [place] [phone]  [phone]   [place] [phone] or [phone] (After Hours)

## 2014-01-31 NOTE — ED Notes (Signed)
Per pt sts she is having vomiting, cold chills, pain with urination back pain and body aches. sts hx of kidney infection

## 2014-01-31 NOTE — ED Notes (Signed)
CT notified that the pt has finished her contrast.  

## 2014-01-31 NOTE — ED Notes (Signed)
CT notified that the pt has not been taken for her scan.

## 2014-01-31 NOTE — ED Notes (Signed)
Patient helped to the restroom.

## 2014-01-31 NOTE — ED Provider Notes (Signed)
CSN: [id number]     Arrival date & time 01/31/14  1251 History   First MD Initiated Contact with Patient 01/31/14 1535     Chief Complaint  Patient presents with  . Emesis  . Generalized Body Aches  . Back Pain     (Consider location/radiation/quality/duration/timing/severity/associated sxs/prior Treatment) The history is provided by the patient and medical records. No language interpreter was used.    Teresa Gamble is a 23 y.o. female  with a hx of pyelonephritis presents to the Emergency Department complaining of gradual, persistent, progressively worsening generally ill feeling onset several days with the onset of nausea and vomiting at 3am today.  She reports approx 10 episodes of emesis today.  Pt also reports beginning to have low back pain this morning as well.  Pt reports hx of kidney stones as well, but reports this is different.  She reports an aching in her bladder when urination with associated malodor and darker urine.  Pt also with persistent nausea and last emesis approx 30 min ago.  Pt reports a subjective fever last night.   Laying on her side in a ball makes it better and stretching out makes it worse.  Pt denies abd pain, headache, neck pain, chest pain, abd pain, diarrhea, dysuria.  Pt denies Hx of abd surgery.    Past Medical History  Diagnosis Date  . Renal stones   . Ovarian cyst    History reviewed. No pertinent past surgical history. History reviewed. No pertinent family history. History  Substance Use Topics  . Smoking status: Former Smoker -- 0.50 packs/day for 5 years  . Smokeless tobacco: Not on file  . Alcohol Use: Yes     Comment: occasional    OB History   Grav Para Term Preterm Abortions TAB SAB Ect Mult Living                 Review of Systems  Constitutional: Positive for fever (subjective). Negative for diaphoresis, appetite change and fatigue.  Respiratory: Negative for shortness of breath.   Cardiovascular: Negative for chest pain.   Gastrointestinal: Positive for nausea. Negative for vomiting, abdominal pain, diarrhea, constipation and blood in stool.  Endocrine: Negative for polydipsia, polyphagia and polyuria.  Genitourinary: Positive for dysuria and flank pain. Negative for urgency, frequency, hematuria and difficulty urinating.  Musculoskeletal: Positive for back pain.  Skin: Negative for rash.  Allergic/Immunologic: Negative for immunocompromised state.  Neurological: Negative for headaches.  Hematological: Negative for adenopathy. Does not bruise/bleed easily.  All other systems reviewed and are negative.      Allergies  Shellfish allergy and Phenergan  Home Medications   Current Outpatient Rx  Name  Route  Sig  Dispense  Refill  . ibuprofen (ADVIL,MOTRIN) 200 MG tablet   Oral   Take 400 mg by mouth every 6 (six) hours as needed for fever, headache or moderate pain.         Marland Kitchen OVER THE COUNTER MEDICATION   Oral   Take 1 capsule by mouth daily. METABOLISM BOOSTER         . vitamin B-12 (CYANOCOBALAMIN) 1000 MCG tablet   Oral   Take 1,000 mcg by mouth daily.         . ondansetron (ZOFRAN ODT) 8 MG disintegrating tablet      8mg  ODT q4 hours prn nausea   8 tablet   0    BP 110/69  Pulse 75  Temp(Src) 98.1 F (36.7 C) (Oral)  Resp 20  SpO2 100%  LMP 01/10/2014 Physical Exam  Nursing note and vitals reviewed. Constitutional: She is oriented to person, place, and time. She appears well-developed and well-nourished. She appears distressed.  Awake, alert,  Pt actively vomiting  HENT:  Head: Normocephalic and atraumatic.  Mouth/Throat: Oropharynx is clear and moist. No oropharyngeal exudate.  Eyes: Conjunctivae are normal. Pupils are equal, round, and reactive to light. No scleral icterus.  Neck: Normal range of motion. Neck supple.  Full ROM without pain  Cardiovascular: Normal rate, regular rhythm, normal heart sounds and intact distal pulses.   No murmur heard. No tachycardia   Pulmonary/Chest: Effort normal and breath sounds normal. No respiratory distress. She has no wheezes.  Clear and equal breath sounds  Abdominal: Soft. Normal appearance and bowel sounds are normal. She exhibits no distension and no mass. There is no hepatosplenomegaly. There is no tenderness. There is CVA tenderness (bilateral; left > right). There is no rebound and no guarding. Hernia confirmed negative in the right inguinal area and confirmed negative in the left inguinal area.  Genitourinary: Uterus normal. Pelvic exam was performed with patient supine. There is no rash, tenderness, lesion or injury on the right labia. There is no rash, tenderness, lesion or injury on the left labia. Uterus is not deviated, not enlarged, not fixed and not tender. Cervix exhibits no motion tenderness, no discharge and no friability. Right adnexum displays no mass, no tenderness and no fullness. Left adnexum displays no mass, no tenderness and no fullness. No erythema, tenderness or bleeding around the vagina. No foreign body around the vagina. No signs of injury around the vagina. Vaginal discharge (thin, white, moderate) found.  Musculoskeletal: Normal range of motion. She exhibits no edema.       Cervical back: Normal.       Thoracic back: Normal.       Lumbar back: She exhibits tenderness and pain.  Full range of motion of the T-spine and L-spine No tenderness to palpation of the spinous processes of the T-spine or L-spine Mild tenderness to palpation of the bilateral paraspinous muscles of the L-spine  Lymphadenopathy:    She has no cervical adenopathy.       Right: No inguinal adenopathy present.       Left: No inguinal adenopathy present.  Neurological: She is alert and oriented to person, place, and time. She has normal reflexes. She exhibits normal muscle tone. Coordination normal.  Speech is clear and goal oriented, follows commands Normal strength in upper and lower extremities bilaterally including  dorsiflexion and plantar flexion, strong and equal grip strength Sensation normal to light and sharp touch Moves extremities without ataxia, coordination intact Normal gait Normal balance   Skin: Skin is warm and dry. No rash noted. She is not diaphoretic. No erythema.  Psychiatric: She has a normal mood and affect. Her behavior is normal.    ED Course  Procedures (including critical care time) Labs Review Labs Reviewed  WET PREP, GENITAL - Abnormal; Notable for the following:    Clue Cells Wet Prep HPF POC MODERATE (*)    WBC, Wet Prep HPF POC FEW (*)    All other components within normal limits  URINALYSIS, ROUTINE W REFLEX MICROSCOPIC - Abnormal; Notable for the following:    APPearance TURBID (*)    All other components within normal limits  URINE MICROSCOPIC-ADD ON - Abnormal; Notable for the following:    Squamous Epithelial / LPF FEW (*)    Bacteria, UA FEW (*)  All other components within normal limits  GC/CHLAMYDIA PROBE AMP  CBC WITH DIFFERENTIAL  COMPREHENSIVE METABOLIC PANEL  PREGNANCY, URINE  POC URINE PREG, ED   Imaging Review Ct Abdomen Pelvis W Contrast  01/31/2014   CLINICAL DATA:  Vomiting and back pain  EXAM: CT ABDOMEN AND PELVIS WITH CONTRAST  TECHNIQUE: Multidetector CT imaging of the abdomen and pelvis was performed using the standard protocol following bolus administration of intravenous contrast.  CONTRAST:  100mL OMNIPAQUE IOHEXOL 300 MG/ML  SOLN  COMPARISON:  02/17/2013  FINDINGS: Lung bases are free of acute infiltrate or sizable effusion. The liver, gallbladder, spleen, adrenal glands and pancreas are within normal limits. The kidneys are well visualized bilaterally. No renal calculi or obstructive changes are seen. Mild scarring is noted in the left kidney stable from the prior exam. The appendix is within normal limits. The bladder is well does tendon. No pelvic mass lesion is seen. An irregular enhancing air is noted within the left ovary which may  represent a involuting cyst. No other focal abnormality is seen.  IMPRESSION: Possible left involuting ovarian cyst. No other focal abnormality is seen.  Chronic scarring in the left kidney is noted.   Electronically Signed   By: Alcide CleverMark  Lukens M.D.   On: 01/31/2014 21:34     EKG Interpretation None      MDM   Final diagnoses:  Nausea and vomiting  Low back pain   Glean SalenRebecca N Willert presents with subjective fevers, flank pain and emesis.  She reports concern about pyelonephritis however urinalysis without evidence of urinary tract infection. Negative pregnancy test. Patient also with history of kidney stones but reports this pain is different. CBC and CMP unremarkable.  7:05 PM Patient continues to vomit.  Will give further treatment and reassess.    10:07 PM Patient without emesis and several hours. She was able to drink contrast for her CT scan.  CT with likely developing left ovarian cyst however she had no adnexal mass or tenderness on exam.  No other acute findings on her CT scan.    I personally reviewed the imaging tests through PACS system  I reviewed available ER/hospitalization records through the EMR  Other labs unremarkable. No cervical motion tenderness or evidence of pelvic inflammatory disease.  BP 110/69  Pulse 75  Temp(Src) 98.1 F (36.7 C) (Oral)  Resp 20  SpO2 100%  LMP 01/10/2014  Will PO trial and reassess.    10:34 PM Pt abdomen remained soft and nontender. Vomiting stopped. Patient tolerating by mouth department.  She is alert, oriented, nontoxic and nonseptic appearing. She has remained afebrile and not tachycardic.  Patient with symptoms consistent with viral gastritis.  Vitals are stable, no fever.  Patient is nontoxic, nonseptic appearing, in no apparent distress.  Patient does not meet the SIRS or Sepsis criteria.  Pt's symptoms have been managed in the department; fluid bolus given.  No signs of dehydration, tolerating PO fluids > 6 oz.  Lungs are  clear.  No focal abdominal pain, no peritoneal signs, no concern for appendicitis, cholecystitis, pancreatitis, ruptured viscus, UTI, kidney stone, PID, ectopic pregnancy or any other abdominal etiology.  Supportive therapy indicated with return if symptoms worsen.  Patient counseled.  I have also discussed reasons to return immediately to the ER.  Patient expresses understanding and agrees with plan.  It has been determined that no acute conditions requiring further emergency intervention are present at this time. The patient/guardian have been advised of the diagnosis and plan.  We have discussed signs and symptoms that warrant return to the ED, such as changes or worsening in symptoms.   Vital signs are stable at discharge.   BP 110/69  Pulse 75  Temp(Src) 98.1 F (36.7 C) (Oral)  Resp 20  SpO2 100%  LMP 01/10/2014  Patient/guardian has voiced understanding and agreed to follow-up with the PCP or specialist.        Dierdre Forth, PA-C 01/31/14 2240

## 2014-01-31 NOTE — ED Notes (Signed)
Patient complained of starting to vomit again. Patient states," I just throw up that yellow acid. It is starting to make my throat hurt." PA notified.

## 2014-02-01 LAB — GC/CHLAMYDIA PROBE AMP
CT Probe RNA: NEGATIVE
GC PROBE AMP APTIMA: NEGATIVE

## 2014-03-06 ENCOUNTER — Encounter (HOSPITAL_COMMUNITY): Payer: Self-pay | Admitting: Emergency Medicine

## 2014-03-06 ENCOUNTER — Emergency Department (HOSPITAL_COMMUNITY)
Admission: EM | Admit: 2014-03-06 | Discharge: 2014-03-06 | Disposition: A | Discharge status: Home / Self Care | Payer: Self-pay | Attending: Emergency Medicine | Admitting: Emergency Medicine

## 2014-03-06 ENCOUNTER — Emergency Department (HOSPITAL_COMMUNITY): Payer: Self-pay

## 2014-03-06 DIAGNOSIS — Z79899 Other long term (current) drug therapy: Secondary | ICD-10-CM | POA: Insufficient documentation

## 2014-03-06 DIAGNOSIS — M899 Disorder of bone, unspecified: Principal | ICD-10-CM | POA: Insufficient documentation

## 2014-03-06 DIAGNOSIS — O200 Threatened abortion: Secondary | ICD-10-CM | POA: Insufficient documentation

## 2014-03-06 DIAGNOSIS — O2390 Unspecified genitourinary tract infection in pregnancy, unspecified trimester: Secondary | ICD-10-CM | POA: Insufficient documentation

## 2014-03-06 DIAGNOSIS — M259 Joint disorder, unspecified: Secondary | ICD-10-CM | POA: Insufficient documentation

## 2014-03-06 DIAGNOSIS — M545 Low back pain, unspecified: Secondary | ICD-10-CM | POA: Insufficient documentation

## 2014-03-06 DIAGNOSIS — M549 Dorsalgia, unspecified: Secondary | ICD-10-CM

## 2014-03-06 DIAGNOSIS — N390 Urinary tract infection, site not specified: Secondary | ICD-10-CM | POA: Insufficient documentation

## 2014-03-06 DIAGNOSIS — Z87442 Personal history of urinary calculi: Secondary | ICD-10-CM | POA: Insufficient documentation

## 2014-03-06 DIAGNOSIS — O210 Mild hyperemesis gravidarum: Secondary | ICD-10-CM | POA: Insufficient documentation

## 2014-03-06 DIAGNOSIS — Z3490 Encounter for supervision of normal pregnancy, unspecified, unspecified trimester: Secondary | ICD-10-CM

## 2014-03-06 DIAGNOSIS — O9989 Other specified diseases and conditions complicating pregnancy, childbirth and the puerperium: Principal | ICD-10-CM

## 2014-03-06 DIAGNOSIS — Z87891 Personal history of nicotine dependence: Secondary | ICD-10-CM | POA: Insufficient documentation

## 2014-03-06 DIAGNOSIS — R112 Nausea with vomiting, unspecified: Secondary | ICD-10-CM

## 2014-03-06 LAB — URINALYSIS, ROUTINE W REFLEX MICROSCOPIC
BILIRUBIN URINE: NEGATIVE
Glucose, UA: NEGATIVE mg/dL
Ketones, ur: 15 mg/dL — AB
Nitrite: POSITIVE — AB
PROTEIN: NEGATIVE mg/dL
Specific Gravity, Urine: 1.026 (ref 1.005–1.030)
UROBILINOGEN UA: 0.2 mg/dL (ref 0.0–1.0)
pH: 6 (ref 5.0–8.0)

## 2014-03-06 LAB — CBC WITH DIFFERENTIAL/PLATELET
BASOS ABS: 0.1 10*3/uL (ref 0.0–0.1)
Basophils Relative: 1 % (ref 0–1)
Eosinophils Absolute: 0.2 10*3/uL (ref 0.0–0.7)
Eosinophils Relative: 2 % (ref 0–5)
HEMATOCRIT: 41 % (ref 36.0–46.0)
HEMOGLOBIN: 14.3 g/dL (ref 12.0–15.0)
LYMPHS PCT: 20 % (ref 12–46)
Lymphs Abs: 1.9 10*3/uL (ref 0.7–4.0)
MCH: 31 pg (ref 26.0–34.0)
MCHC: 34.9 g/dL (ref 30.0–36.0)
MCV: 88.7 fL (ref 78.0–100.0)
MONOS PCT: 6 % (ref 3–12)
Monocytes Absolute: 0.6 10*3/uL (ref 0.1–1.0)
Neutro Abs: 6.6 10*3/uL (ref 1.7–7.7)
Neutrophils Relative %: 71 % (ref 43–77)
Platelets: 227 10*3/uL (ref 150–400)
RBC: 4.62 MIL/uL (ref 3.87–5.11)
RDW: 12.8 % (ref 11.5–15.5)
WBC: 9.3 10*3/uL (ref 4.0–10.5)

## 2014-03-06 LAB — WET PREP, GENITAL
Clue Cells Wet Prep HPF POC: NONE SEEN
Trich, Wet Prep: NONE SEEN
Yeast Wet Prep HPF POC: NONE SEEN

## 2014-03-06 LAB — COMPREHENSIVE METABOLIC PANEL
ALT: 12 U/L (ref 0–35)
AST: 14 U/L (ref 0–37)
Albumin: 4.3 g/dL (ref 3.5–5.2)
Alkaline Phosphatase: 65 U/L (ref 39–117)
BILIRUBIN TOTAL: 0.8 mg/dL (ref 0.3–1.2)
BUN: 13 mg/dL (ref 6–23)
CHLORIDE: 100 meq/L (ref 96–112)
CO2: 21 meq/L (ref 19–32)
CREATININE: 0.68 mg/dL (ref 0.50–1.10)
Calcium: 9.1 mg/dL (ref 8.4–10.5)
GFR calc Af Amer: >90 mL/min (ref >90)
Glucose, Bld: 87 mg/dL (ref 70–99)
Potassium: 3.4 mEq/L — ABNORMAL LOW (ref 3.7–5.3)
Sodium: 139 mEq/L (ref 137–147)
Total Protein: 7.6 g/dL (ref 6.0–8.3)

## 2014-03-06 LAB — URINE MICROSCOPIC-ADD ON

## 2014-03-06 LAB — LIPASE, BLOOD: Lipase: 24 U/L (ref 11–59)

## 2014-03-06 LAB — PREGNANCY, URINE: Preg Test, Ur: POSITIVE — AB

## 2014-03-06 LAB — HCG, QUANTITATIVE, PREGNANCY: hCG, Beta Chain, Quant, S: 873 m[IU]/mL — ABNORMAL HIGH (ref <5)

## 2014-03-06 LAB — ABO/RH: ABO/RH(D): O POS

## 2014-03-06 MED ORDER — DEXTROSE 5 % IV SOLN: 1.0000 g | Freq: Once | INTRAVENOUS | Status: DC

## 2014-03-06 MED ORDER — ONDANSETRON 4 MG PO TBDP: 4.0000 mg | ORAL_TABLET | Freq: Three times a day (TID) | ORAL | Status: DC | PRN

## 2014-03-06 MED ORDER — SODIUM CHLORIDE 0.9 % IV BOLUS (SEPSIS)
1000.0000 mL | Freq: Once | INTRAVENOUS | Status: AC
  Administered 2014-03-06: 1000 mL via INTRAVENOUS

## 2014-03-06 MED ORDER — DEXTROSE 5 % IV SOLN
1.0000 g | INTRAVENOUS | Status: DC
  Administered 2014-03-06: 1 g via INTRAVENOUS
  Filled 2014-03-06: qty 10

## 2014-03-06 MED ORDER — MORPHINE SULFATE 4 MG/ML IJ SOLN
4.0000 mg | Freq: Once | INTRAMUSCULAR | Status: AC
  Administered 2014-03-06: 4 mg via INTRAVENOUS
  Filled 2014-03-06: qty 1

## 2014-03-06 MED ORDER — CEPHALEXIN 500 MG PO CAPS: 500.0000 mg | ORAL_CAPSULE | Freq: Two times a day (BID) | ORAL | Status: DC

## 2014-03-06 MED ORDER — KETOROLAC TROMETHAMINE 30 MG/ML IJ SOLN
30.0000 mg | Freq: Once | INTRAMUSCULAR | Status: AC
  Administered 2014-03-06: 30 mg via INTRAVENOUS
  Filled 2014-03-06: qty 1

## 2014-03-06 MED ORDER — ONDANSETRON HCL 4 MG/2ML IJ SOLN
4.0000 mg | Freq: Once | INTRAMUSCULAR | Status: AC
  Administered 2014-03-06: 4 mg via INTRAVENOUS
  Filled 2014-03-06: qty 2

## 2014-03-06 MED ORDER — METOCLOPRAMIDE HCL 5 MG/ML IJ SOLN
10.0000 mg | Freq: Once | INTRAMUSCULAR | Status: AC
  Administered 2014-03-06: 10 mg via INTRAVENOUS
  Filled 2014-03-06: qty 2

## 2014-03-06 MED ORDER — MORPHINE SULFATE 2 MG/ML IJ SOLN
2.0000 mg | Freq: Once | INTRAMUSCULAR | Status: AC
  Administered 2014-03-06: 2 mg via INTRAVENOUS
  Filled 2014-03-06: qty 1

## 2014-03-06 NOTE — ED Provider Notes (Signed)
4:15 PM = Received sign-out from [facility]. Await [place]. Follow-up with OB/GYN for repeat Beta-HCG. Treat for UTI. Sent home with Zofran for nausea.   Filed Vitals:   03/06/14 1530 03/06/14 1600 03/06/14 1720 03/06/14 1857  BP: 102/66 104/64 103/68 117/71  Pulse: 66 67 69 62  Temp:      TempSrc:      Resp:   15 16  SpO2: 99% 100% 99% 100%    Results for orders placed during the hospital encounter of 03/06/14  WET PREP, GENITAL      Result Value Ref Range   Yeast Wet Prep HPF POC NONE SEEN  NONE SEEN   Trich, Wet Prep NONE SEEN  NONE SEEN   Clue Cells Wet Prep HPF POC NONE SEEN  NONE SEEN   WBC, Wet Prep HPF POC FEW (*) NONE SEEN  CBC WITH DIFFERENTIAL      Result Value Ref Range   WBC 9.3  4.0 - 10.5 K/uL   RBC 4.62  3.87 - 5.11 MIL/uL   Hemoglobin 14.3  12.0 - 15.0 g/dL   HCT 16.1  09.6 - 04.5 %   MCV 88.7  78.0 - 100.0 fL   MCH 31.0  26.0 - 34.0 pg   MCHC 34.9  30.0 - 36.0 g/dL   RDW 40.9  81.1 - 91.4 %   Platelets 227  150 - 400 K/uL   Neutrophils Relative % 71  43 - 77 %   Neutro Abs 6.6  1.7 - 7.7 K/uL   Lymphocytes Relative 20  12 - 46 %   Lymphs Abs 1.9  0.7 - 4.0 K/uL   Monocytes Relative 6  3 - 12 %   Monocytes Absolute 0.6  0.1 - 1.0 K/uL   Eosinophils Relative 2  0 - 5 %   Eosinophils Absolute 0.2  0.0 - 0.7 K/uL   Basophils Relative 1  0 - 1 %   Basophils Absolute 0.1  0.0 - 0.1 K/uL  COMPREHENSIVE METABOLIC PANEL      Result Value Ref Range   Sodium 139  137 - 147 mEq/L   Potassium 3.4 (*) 3.7 - 5.3 mEq/L   Chloride 100  96 - 112 mEq/L   CO2 21  19 - 32 mEq/L   Glucose, Bld 87  70 - 99 mg/dL   BUN 13  6 - 23 mg/dL   Creatinine, Ser 7.82  0.50 - 1.10 mg/dL   Calcium 9.1  8.4 - 95.6 mg/dL   Total Protein 7.6  6.0 - 8.3 g/dL   Albumin 4.3  3.5 - 5.2 g/dL   AST 14  0 - 37 U/L   ALT 12  0 - 35 U/L   Alkaline Phosphatase 65  39 - 117 U/L   Total Bilirubin 0.8  0.3 - 1.2 mg/dL   GFR calc non Af Amer >90  >90 mL/min   GFR calc Af Amer >90  >90  mL/min  URINALYSIS, ROUTINE W REFLEX MICROSCOPIC      Result Value Ref Range   Color, Urine YELLOW  YELLOW   APPearance CLOUDY (*) CLEAR   Specific Gravity, Urine 1.026  1.005 - 1.030   pH 6.0  5.0 - 8.0   Glucose, UA NEGATIVE  NEGATIVE mg/dL   Hgb urine dipstick SMALL (*) NEGATIVE   Bilirubin Urine NEGATIVE  NEGATIVE   Ketones, ur 15 (*) NEGATIVE mg/dL   Protein, ur NEGATIVE  NEGATIVE mg/dL   Urobilinogen, UA 0.2  0.0 - 1.0 mg/dL   Nitrite POSITIVE (*) NEGATIVE   Leukocytes, UA SMALL (*) NEGATIVE  LIPASE, BLOOD      Result Value Ref Range   Lipase 24  11 - 59 U/L  URINE MICROSCOPIC-ADD ON      Result Value Ref Range   Squamous Epithelial / LPF FEW (*) RARE   WBC, UA 3-6  <3 WBC/hpf   RBC / HPF 3-6  <3 RBC/hpf   Bacteria, UA MANY (*) RARE  PREGNANCY, URINE      Result Value Ref Range   Preg Test, Ur POSITIVE (*) NEGATIVE  HCG, QUANTITATIVE, PREGNANCY      Result Value Ref Range   hCG, Beta Chain, Quant, S 873 (*) <5 mIU/mL  ABO/RH      Result Value Ref Range   ABO/RH(D) O POS     No rh immune globuloin NOT A RH IMMUNE GLOBULIN CANDIDATE, PT RH POSITIVE      US OB Comp Less 14 Wks (Final result)  Result time: 03/06/14 18:33:54    Final result by Rad Results In Interface (03/06/14 18:33:54)    Narrative:   CLINICAL DATA: Positive pregnancy test, unsure LMP, back pain, vaginal bleeding  EXAM: OBSTETRIC <14 WK [place] AND TRANSVAGINAL OB US  TECHNIQUE: Both transabdominal and transvaginal ultrasound examinations were performed for complete evaluation of the gestation as well as the maternal uterus, adnexal regions, and pelvic cul-de-sac. Transvaginal technique was performed to assess early pregnancy.  COMPARISON: None for this pregnancy  FINDINGS: Intrauterine gestational sac: Visualized/normal in shape.  Yolk sac: Not visualized  Embryo: Not visualized  Cardiac Activity: No embryo seen  MSD: 4 mm 5 w 1 d  Maternal uterus/adnexae: Right ovarian corpus luteum  noted. The left ovary is normal. Small free fluid is identified.  IMPRESSION: Intrauterine gestational sac but no yolk sac, fetal pole, or cardiac activity yet identified. Followup ultrasound is recommended in 10-14 days to document appropriate pregnancy progression and for purposes of accurate dating.   Electronically Signed By: Christiana Pellant M.D. On: 03/06/2014 18:33          Teresa Gamble is a 23 y.o. female with a PMH of renal stones, ovarian cysts, and pyelonephritis who present to the ED for evaluation of back pain x 2 weeks. Etiology of back pain possibly due to pregnancy vs threatened abortion vs UTI vs musculoskeletal in nature. Patient had positive pregnancy with Beta-HCG 873. LNMP 3-4 weeks ago? US showed an intrauterine gestational sac with no yolk sac fetal pole or cardiac activity. Patient had vaginal bleeding, which was initially thought to be her menstrual cycle. Rh positive. Pelvic exam benign per previous PA's notes (unclear if Os open/closed). Gonorrhea and chlamydia testing pending. Wet mount unremarkable. Patient also found to have a UTI. Urine sent for culture. Doubt pyelonephritis. Patient afebrile and non-toxic in appearance. Patient given IV Rocephin in the ED. No leukocytosis. Labs otherwise unremarkable. Has had multiple episodes of emesis PTA. Given Zofran in the ED and able to tolerate fluids. Vital signs stable. Patient will require repeat Beta-HCG testing in two days and close OB/GYN follow-up. Given resources to establish care. Return precautions, discharge instructions, and follow-up was discussed with the patient before discharge.     Rechecks  4:15 PM = Pain worsening. 4 mg morphine ordered.  7:30 PM = Spoke with patient regarding results, discharge instructions and return precautions. Abdominal exam benign. Tenderness to palpation to lower paraspinal muscles diffusely.     Discharge Medication List  as of 03/06/2014  7:40 PM    START taking these  medications   Details  cephALEXin (KEFLEX) 500 MG capsule Take 1 capsule (500 mg total) by mouth 2 (two) times daily., Starting 03/06/2014, Until Discontinued, Print    ondansetron (ZOFRAN ODT) 4 MG disintegrating tablet Take 1 tablet (4 mg total) by mouth every 8 (eight) hours as needed for nausea., Starting 03/06/2014, Until Discontinued, Print         Final impressions: 1. Pregnancy   2. Nausea and vomiting   3. Back pain   4. UTI (urinary tract infection)   5. Threatened abortion       Luiz IronJessica Katlin Kazumi Lachney PA-C          Teresa LedgerJessica K Manan Olmo, PA-C 03/07/14 [phone]

## 2014-03-06 NOTE — Discharge Instructions (Signed)
Follow-up in two days for repeat beta hCG testing  Take zofran as needed for nausea and vomiting  Take Antibiotic for full dose for your UTI  Drink plenty of fluids and rest  You need follow-up for repeat US in 2 weeks - call OB/GYN as soon as possible  Return to the emergency department if you develop any changing/worsening condition, worsening abdominal pain/vaginal bleeding, feeling faint, uncontrolled pain, repeated vomiting, fever or any other concerns (please read additional information regarding your condition below)    Pregnancy If you are planning on getting pregnant, it is a good idea to make a preconception appointment with your caregiver to discuss having a healthy lifestyle before getting pregnant. This includes diet, weight, exercise, taking prenatal vitamins (especially folic acid, which helps prevent brain and spinal cord defects), avoiding alcohol, smoking and illegal drugs, medical problems (diabetes, convulsions), family history of genetic problems, working conditions, and immunizations. It is better to have knowledge of these things and do something about them before getting pregnant. During your pregnancy, it is important to follow certain guidelines in order to have a healthy baby. It is very important to get good prenatal care and follow your caregiver's instructions. Prenatal care includes all the medical care you receive before your baby's birth. This helps to prevent problems during the pregnancy and childbirth. HOME CARE INSTRUCTIONS   Start your prenatal visits by the 12th week of pregnancy or earlier, if possible. At first, appointments are usually scheduled monthly. They become more frequent in the last 2 months before delivery. It is important that you keep your caregiver's appointments and follow your caregiver's instructions regarding medication use, exercise, and diet.  During pregnancy, you are providing food for you and your baby. Eat a regular, well-balanced diet.  Choose foods such as meat, fish, milk and other dairy products, vegetables, fruits, whole-grain breads and cereals. Your caregiver will inform you of the ideal weight gain depending on your current height and weight. Drink lots of liquids. Try to drink 8 glasses of water a day.  Alcohol is associated with a number of birth defects including fetal alcohol syndrome. It is best to avoid alcohol completely. Smoking will cause low birth rate and prematurity. Use of alcohol and nicotine during your pregnancy also increases the chances that your child will be chemically dependent later in their life and may contribute to SIDS (Sudden Infant Death Syndrome).  Do not use illegal drugs.  Only take prescription or over-the-counter medications that are recommended by your caregiver. Other medications can cause genetic and physical problems in the baby.  Morning sickness can often be helped by keeping soda crackers at the bedside. Eat a few before getting up in the morning.  A sexual relationship may be continued until near the end of pregnancy if there are no other problems such as early (premature) leaking of amniotic fluid from the membranes, vaginal bleeding, painful intercourse or belly (abdominal) pain.  Exercise regularly. Check with your caregiver if you are unsure of the safety of some of your exercises.  Do not use hot tubs, steam rooms or saunas. These increase the risk of fainting and hurting yourself and the baby. Swimming is OK for exercise. Get plenty of rest, including afternoon naps when possible, especially in the third trimester.  Avoid toxic odors and chemicals.  Do not wear high heels. They may cause you to lose your balance and fall.  Do not lift over 5 pounds. If you do lift anything, lift with your legs  and thighs, not your back.  Avoid long trips, especially in the third trimester.  If you have to travel out of the city or state, take a copy of your medical records with you. SEEK  IMMEDIATE MEDICAL CARE IF:   You develop an unexplained oral temperature above 102 F (38.9 C), or as your caregiver suggests.  You have leaking of fluid from the vagina. If leaking membranes are suspected, take your temperature and inform your caregiver of this when you call.  There is vaginal spotting or bleeding. Notify your caregiver of the amount and how many pads are used.  You continue to feel sick to your stomach (nauseous) and have no relief from remedies suggested, or you throw up (vomit) blood or coffee ground like materials.  You develop upper abdominal pain.  You have round ligament discomfort in the lower abdominal area. This still must be evaluated by your caregiver.  You feel contractions of the uterus.  You do not feel the baby move, or there is less movement than before.  You have painful urination.  You have abnormal vaginal discharge.  You have persistent diarrhea.  You get a severe headache.  You have problems with your vision.  You develop muscle weakness.  You feel dizzy and faint.  You develop shortness of breath.  You develop chest pain.  You have back pain that travels down to your leg and feet.  You feel irregular or a very fast heartbeat.  You develop excessive weight gain in a short period of time (5 pounds in 3 to 5 days).  You are involved in a domestic violence situation. Document Released: 11/11/2005 Document Revised: 05/12/2012 Document Reviewed: 05/05/2009 [facility] Patient Information 2014 [place], [place].  Urinary Tract Infection Urinary tract infections (UTIs) can develop anywhere along your urinary tract. Your urinary tract is your body's drainage system for removing wastes and extra water. Your urinary tract includes two kidneys, two ureters, a bladder, and a urethra. Your kidneys are a pair of bean-shaped organs. Each kidney is about the size of your fist. They are located below your ribs, one on each side of your  spine. CAUSES Infections are caused by microbes, which are microscopic organisms, including fungi, viruses, and bacteria. These organisms are so small that they can only be seen through a microscope. Bacteria are the microbes that most commonly cause UTIs. SYMPTOMS  Symptoms of UTIs may vary by age and gender of the patient and by the location of the infection. Symptoms in young women typically include a frequent and intense urge to urinate and a painful, burning feeling in the bladder or urethra during urination. Older women and men are more likely to be tired, shaky, and weak and have muscle aches and abdominal pain. A fever may mean the infection is in your kidneys. Other symptoms of a kidney infection include pain in your back or sides below the ribs, nausea, and vomiting. DIAGNOSIS To diagnose a UTI, your caregiver will ask you about your symptoms. Your caregiver also will ask to provide a urine sample. The urine sample will be tested for bacteria and white blood cells. White blood cells are made by your body to help fight infection. TREATMENT  Typically, UTIs can be treated with medication. Because most UTIs are caused by a bacterial infection, they usually can be treated with the use of antibiotics. The choice of antibiotic and length of treatment depend on your symptoms and the type of bacteria causing your infection. HOME CARE INSTRUCTIONS  If you were prescribed antibiotics, take them exactly as your caregiver instructs you. Finish the medication even if you feel better after you have only taken some of the medication.  Drink enough water and fluids to keep your urine clear or pale yellow.  Avoid caffeine, tea, and carbonated beverages. They tend to irritate your bladder.  Empty your bladder often. Avoid holding urine for long periods of time.  Empty your bladder before and after sexual intercourse.  After a bowel movement, women should cleanse from front to back. Use each tissue only  once. SEEK MEDICAL CARE IF:   You have back pain.  You develop a fever.  Your symptoms do not begin to resolve within 3 days. SEEK IMMEDIATE MEDICAL CARE IF:   You have severe back pain or lower abdominal pain.  You develop chills.  You have nausea or vomiting.  You have continued burning or discomfort with urination. MAKE SURE YOU:   Understand these instructions.  Will watch your condition.  Will get help right away if you are not doing well or get worse. Document Released: 08/21/2005 Document Revised: 05/12/2012 Document Reviewed: 12/20/2011 [facility] Patient Information 2014 [place], [place].  Threatened Miscarriage Bleeding during the first 20 weeks of pregnancy is common. This is sometimes called a threatened miscarriage. This is a pregnancy that is threatening to end before the twentieth week of pregnancy. Often this bleeding stops with bed rest or decreased activities as suggested by your caregiver and the pregnancy continues without any more problems. You may be asked to not have sexual intercourse, have orgasms or use tampons until further notice. Sometimes a threatened miscarriage can progress to a complete or incomplete miscarriage. This may or may not require further treatment. Some miscarriages occur before a woman misses a menstrual period and knows she is pregnant. Miscarriages occur in 15 to 20% of all pregnancies and usually occur during the first 13 weeks of the pregnancy. The exact cause of a miscarriage is usually never known. A miscarriage is natures way of ending a pregnancy that is abnormal or would not make it to term. There are some things that may put you at risk to have a miscarriage, such as:  Hormone problems.  Infection of the uterus or cervix.  Chronic illness, diabetes for example, especially if it is not controlled.  Abnormal shaped uterus.  Fibroids in the uterus.  Incompetent cervix (the cervix is too weak to hold the  baby).  Smoking.  Drinking too much alcohol. It's best not to drink any alcohol when you are pregnant.  Taking illegal drugs. TREATMENT  When a miscarriage becomes complete and all products of conception (all the tissue in the uterus) have been passed, often no treatment is needed. If you think you passed tissue, save it in a container and take it to your doctor for evaluation. If the miscarriage is incomplete (parts of the fetus or placenta remain in the uterus), further treatment may be needed. The most common reason for further treatment is continued bleeding (hemorrhage) because pregnancy tissue did not pass out of the uterus. This often occurs if a miscarriage is incomplete. Tissue left behind may also become infected. Treatment usually is dilatation and curettage (the removal of the remaining products of pregnancy. This can be done by a simple sucking procedure (suction curettage) or a simple scraping of the inside of the uterus. This may be done in the hospital or in the caregiver's office. This is only done when your caregiver knows that there  is no chance for the pregnancy to proceed to term. This is determined by physical examination, negative pregnancy test, falling pregnancy hormone count and/or, an ultrasound revealing a dead fetus. Miscarriages are often a very emotional time for prospective mothers and fathers. This is not you or your partners fault. It did not occur because of an inadequacy in you or your partner. Nearly all miscarriages occur because the pregnancy has started off wrongly. At least half of these pregnancies have a chromosomal abnormality. It is almost always not inherited. Others may have developmental problems with the fetus or placenta. This does not always show up even when the products miscarried are studied under the microscope. The miscarriage is nearly always not your fault and it is not likely that you could have prevented it from happening. If you are having  emotional and grieving problems, talk to your health care provider and even seek counseling, if necessary, before getting pregnant again. You can begin trying for another pregnancy as soon as your caregiver says it is OK. HOME CARE INSTRUCTIONS   Your caregiver may order bed rest depending on how much bleeding and cramping you are having. You may be limited to only getting up to go to the bathroom. You may be allowed to continue light activity. You may need to make arrangements for the care of your other children and for any other responsibilities.  Keep track of the number of pads you use each day, how often you have to change pads and how saturated (soaked) they are. Record this information.  DO NOT USE TAMPONS. Do not douche, have sexual intercourse or orgasms until approved by your caregiver.  You may receive a follow up appointment for re-evaluation of your pregnancy and a repeat blood test. Re-evaluation often occurs after 2 days and again in 4 to 6 weeks. It is very important that you follow-up in the recommended time period.  If you are Rh negative and the father is Rh positive or you do not know the fathers' blood type, you may receive a shot (Rh immune globulin) to help prevent abnormal antibodies that can develop and affect the baby in any future pregnancies. SEEK IMMEDIATE MEDICAL CARE IF:  You have severe cramps in your stomach, back, or abdomen.  You have a sudden onset of severe pain in the lower part of your abdomen.  You develop chills.  You run an unexplained temperature of 101 F (38.3 C) or higher.  You pass large clots or tissue. Save any tissue for your caregiver to inspect.  Your bleeding increases or you become light-headed, weak, or have fainting episodes.  You have a gush of fluid from your vagina.  You pass out. This could mean you have a tubal (ectopic) pregnancy. Document Released: 11/11/2005 Document Revised: 02/03/2012 Document Reviewed:  06/27/2008 [facility] Patient Information 2014 [place], [place].   Back Pain, Adult Low back pain is very common. About 1 in 5 people have back pain.The cause of low back pain is rarely dangerous. The pain often gets better over time.About half of people with a sudden onset of back pain feel better in just 2 weeks. About 8 in 10 people feel better by 6 weeks.  CAUSES Some common causes of back pain include:  Strain of the muscles or ligaments supporting the spine.  Wear and tear (degeneration) of the spinal discs.  Arthritis.  Direct injury to the back. DIAGNOSIS Most of the time, the direct cause of low back pain is not known.However, back  pain can be treated effectively even when the exact cause of the pain is unknown.Answering your caregiver's questions about your overall health and symptoms is one of the most accurate ways to make sure the cause of your pain is not dangerous. If your caregiver needs more information, he or she may order lab work or imaging tests (X-rays or MRIs).However, even if imaging tests show changes in your back, this usually does not require surgery. HOME CARE INSTRUCTIONS For many people, back pain returns.Since low back pain is rarely dangerous, it is often a condition that people can learn to [facility] their own.   Remain active. It is stressful on the back to sit or stand in one place. Do not sit, drive, or stand in one place for more than 30 minutes at a time. Take short walks on level surfaces as soon as pain allows.Try to increase the length of time you walk each day.  Do not stay in bed.Resting more than 1 or 2 days can delay your recovery.  Do not avoid exercise or work.Your body is made to move.It is not dangerous to be active, even though your back may hurt.Your back will likely heal faster if you return to being active before your pain is gone.  Pay attention to your body when you bend and lift. Many people have less discomfortwhen lifting  if they bend their knees, keep the load close to their bodies,and avoid twisting. Often, the most comfortable positions are those that put less stress on your recovering back.  Find a comfortable position to sleep. Use a firm mattress and lie on your side with your knees slightly bent. If you lie on your back, put a pillow under your knees.  Only take over-the-counter or prescription medicines as directed by your caregiver. Over-the-counter medicines to reduce pain and inflammation are often the most helpful.Your caregiver may prescribe muscle relaxant drugs.These medicines help dull your pain so you can more quickly return to your normal activities and healthy exercise.  Put ice on the injured area.  Put ice in a plastic bag.  Place a towel between your skin and the bag.  Leave the ice on for 15-20 minutes, 03-04 times a day for the first 2 to 3 days. After that, ice and heat may be alternated to reduce pain and spasms.  Ask your caregiver about trying back exercises and gentle massage. This may be of some benefit.  Avoid feeling anxious or stressed.Stress increases muscle tension and can worsen back pain.It is important to recognize when you are anxious or stressed and learn ways to manage it.Exercise is a great option. SEEK MEDICAL CARE IF:  You have pain that is not relieved with rest or medicine.  You have pain that does not improve in 1 week.  You have new symptoms.  You are generally not feeling well. SEEK IMMEDIATE MEDICAL CARE IF:   You have pain that radiates from your back into your legs.  You develop new bowel or bladder control problems.  You have unusual weakness or numbness in your arms or legs.  You develop nausea or vomiting.  You develop abdominal pain.  You feel faint. Document Released: 11/11/2005 Document Revised: 05/12/2012 Document Reviewed: 04/01/2011 [facility] Patient Information 2014 [place], [place].  Nausea and Vomiting Nausea is a sick  feeling that often comes before throwing up (vomiting). Vomiting is a reflex where stomach contents come out of your mouth. Vomiting can cause severe loss of body fluids (dehydration). Children and elderly adults  can become dehydrated quickly, especially if they also have diarrhea. Nausea and vomiting are symptoms of a condition or disease. It is important to find the cause of your symptoms. CAUSES   Direct irritation of the stomach lining. This irritation can result from increased acid production (gastroesophageal reflux disease), infection, food poisoning, taking certain medicines (such as nonsteroidal anti-inflammatory drugs), alcohol use, or tobacco use.  Signals from the brain.These signals could be caused by a headache, heat exposure, an inner ear disturbance, increased pressure in the brain from injury, infection, a tumor, or a concussion, pain, emotional stimulus, or metabolic problems.  An obstruction in the gastrointestinal tract (bowel obstruction).  Illnesses such as diabetes, hepatitis, gallbladder problems, appendicitis, kidney problems, cancer, sepsis, atypical symptoms of a heart attack, or eating disorders.  Medical treatments such as chemotherapy and radiation.  Receiving medicine that makes you sleep (general anesthetic) during surgery. DIAGNOSIS Your caregiver may ask for tests to be done if the problems do not improve after a few days. Tests may also be done if symptoms are severe or if the reason for the nausea and vomiting is not clear. Tests may include:  Urine tests.  Blood tests.  Stool tests.  Cultures (to look for evidence of infection).  X-rays or other imaging studies. Test results can help your caregiver make decisions about treatment or the need for additional tests. TREATMENT You need to stay well hydrated. Drink frequently but in small amounts.You may wish to drink water, sports drinks, clear broth, or eat frozen ice pops or gelatin dessert to help stay  hydrated.When you eat, eating slowly may help prevent nausea.There are also some antinausea medicines that may help prevent nausea. HOME CARE INSTRUCTIONS   Take all medicine as directed by your caregiver.  If you do not have an appetite, do not force yourself to eat. However, you must continue to drink fluids.  If you have an appetite, eat a normal diet unless your caregiver tells you differently.  Eat a variety of complex carbohydrates (rice, wheat, potatoes, bread), lean meats, yogurt, fruits, and vegetables.  Avoid high-fat foods because they are more difficult to digest.  Drink enough water and fluids to keep your urine clear or pale yellow.  If you are dehydrated, ask your caregiver for specific rehydration instructions. Signs of dehydration may include:  Severe thirst.  Dry lips and mouth.  Dizziness.  Dark urine.  Decreasing urine frequency and amount.  Confusion.  Rapid breathing or pulse. SEEK IMMEDIATE MEDICAL CARE IF:   You have blood or brown flecks (like coffee grounds) in your vomit.  You have black or bloody stools.  You have a severe headache or stiff neck.  You are confused.  You have severe abdominal pain.  You have chest pain or trouble breathing.  You do not urinate at least once every 8 hours.  You develop cold or clammy skin.  You continue to vomit for longer than 24 to 48 hours.  You have a fever. MAKE SURE YOU:   Understand these instructions.  Will watch your condition.  Will get help right away if you are not doing well or get worse. Document Released: 11/11/2005 Document Revised: 02/03/2012 Document Reviewed: 04/10/2011 [facility] Patient Information 2014 [place], [place].   Emergency Department Resource Guide 1) Find a Doctor and Pay Out of Pocket Although you won't have to find out who is covered by your insurance plan, it is a good idea to ask around and get recommendations. You will then need to  call the office and see if  the doctor you have chosen will accept you as a new patient and what types of options they offer for patients who are self-pay. Some doctors offer discounts or will set up payment plans for their patients who do not have insurance, but you will need to ask so you aren't surprised when you get to your appointment.  2) Contact Your Local Health Department Not all health departments have doctors that can see patients for sick visits, but many do, so it is worth a call to see if yours does. If you don't know where your local health department is, you can check in your phone book. The CDC also has a tool to help you locate your state's health department, and many state websites also have listings of all of their local health departments.  3) Find a Walk-in Clinic If your illness is not likely to be very severe or complicated, you may want to try a walk in clinic. These are popping up all over the country in pharmacies, drugstores, and shopping centers. They're usually staffed by nurse practitioners or physician assistants that have been trained to treat common illnesses and complaints. They're usually fairly quick and inexpensive. However, if you have serious medical issues or chronic medical problems, these are probably not your best option.  No Primary Care Doctor: - Call [facility] at  [phone] - they can help you locate a primary care doctor that  accepts your insurance, provides certain services, etc. - Physician Referral Service- [phone]  Chronic Pain Problems: Organization         Address  Phone   Notes  [facility]  [phone] Patients need to be referred by their primary care doctor.   Medication Assistance: Organization         Address  Phone   Notes  [facility] [address] [place], [place] [id number] [phone] --Must be a resident of [place] -- Must have NO insurance coverage whatsoever (no  Medicaid/ Medicare, etc.) -- The pt. MUST have a primary care doctor that directs their care regularly and follows them in the community   [facility]  [phone]   [employer]  [phone]    Agencies that provide inexpensive medical care: Organization         Address  Phone   Notes  [facility]  [phone]   [facility]    [phone]   [facility] [address] [place], [place] [phone]) [phone]   [facility] [address], [place] [phone]   [facility]    [phone]   [facility]    [phone]) [phone]   [facility]  [address], [place] Phone:  [phone]) [phone], Fax:  [phone] Hours of Operation:  9 am - 6 pm, M-F.  Also accepts Medicaid/Medicare and self-pay.  [facility]  [address], [place] Phone: [phone], Fax: [phone]. Hours of Operation:  8:30 am - 5:30 pm, M-F.  Also accepts Medicaid and self-pay.  [facility] [place] [address], [place] Phone: [phone]) [phone]   [facility] [address], [place], [place] [phone], Ext. [phone] Mondays & Thursdays: 7-9 AM.  First 15 patients are seen on a first come, first serve basis.  Medicaid-accepting [facility] Providers:  Organization         Address  Phone   Notes  [facility] [address], [place] [phone] Also accepts self-pay patients.  [facility] [address] Laurell Josephs [place], [place]  [phone]   [facility] [address], [place] [phone]   [facility] [address], [place] [phone]   Renaye Rakers [address], [place]   [phone] Only accepts [place] Access [place] patients after they have their name applied to their card.    Self-Pay (no insurance) in [facility]:  Organization         Address  Phone   Notes  Sickle Cell Patients, [facility] [address] [place], [place] [phone]   [facility] [address] [place], [place] [phone]   [facility] [place]  [address], [place] [phone]   [facility]/Dr. Osei-Bonsu  [address] [place] or [address], [place] [phone] Phone number for both [place] and [place] locations is the same.  [facility] [address] [place] [phone]   [facility] [address], [place] or [address] [phone]   [facility] [address] [place] [phone], phone; [phone], fax Sees patients 1st and 3rd Saturday of every month.  Must not qualify for public or private insurance (i.e. Medicaid, Medicare, [place] Health Choice, Veterans' Benefits)  Household income should be no more than 200% of the poverty level The clinic cannot treat you if you are pregnant or think you are pregnant  Sexually transmitted diseases are not treated at the clinic.    Dental Care: Organization         Address  Phone  Notes  [facility] [address] [place], [place] [phone] Accepts children up to age 63 who are enrolled in [place] or [place] Health Choice; pregnant women with a Medicaid card; and children who have applied for Medicaid or [place] Health Choice, but were declined, whose parents can pay a reduced fee at time of service.  [facility] of [facility]  [address], [place] [phone] Accepts children up to age 48 who are enrolled in [place] or [place] Health Choice; pregnant women with a Medicaid card; and children who have applied for Medicaid or [place] Health Choice,  but were declined, whose parents can pay a reduced fee at time of service.  [facility]  [address] [place], [place] [phone] Patients are seen by appointment only. Walk-ins are not accepted. [facility] will see patients 54 years of age and older. Monday - Tuesday (8am-5pm) Most Wednesdays (8:30-5pm) $30 per visit, cash only  [facility]  [address], [facility] [phone] Patients are seen by appointment only. Walk-ins are not accepted. [facility] will see patients 24 years of age and older. One Wednesday Evening (Monthly: Volunteer Based).  $30 per visit, cash only  [employer]  [phone] for adults; Children under age 38, call Graduate Pediatric Dentistry at [phone]. Children aged 47-14, please call [phone] to request a  pediatric application.  Dental services are provided in all areas of dental care including fillings, crowns and bridges, complete and partial dentures, implants, gum treatment, root canals, and extractions. Preventive care is also provided. Treatment is provided to both adults and children. Patients are selected via a lottery and there is often a waiting list.   [facility] [address] [place]  [phone] [email or web address]   [facility] [address] [place], [place] [phone], Ext. [phone] Second and Fourth Thursday of each month, opens at 6:30 AM; Clinic ends at 9 AM.  Patients are seen on a first-come first-served basis, and a limited number are seen during each clinic.   [facility]  [address] Ether Griffins [place], [place] [phone]   Eligibility Requirements You must have lived in [place], [place], or [place] counties for at least the last three months.   You cannot be eligible for state or federal sponsored [place], including [employer], [place], or [employer].   You generally  cannot be eligible for healthcare insurance through your employer.    How to apply: Eligibility screenings are held every Tuesday and Wednesday afternoon from 1:00 pm until 4:00 pm. You do not need an appointment for the interview!  [facility] [address] [place], [place] [phone]   [facility] Health Department  [phone]   [facility] Health Department  [phone]   [facility] Health Department  [phone]    Behavioral Health Resources in the Community: Intensive Outpatient Programs Organization         Address  Phone  Notes  [facility] [address], [place], [place] [phone]   [facility] Outpatient [address], [place], [place] [phone]   [facility] [address], [place], [place]  [phone]   [facility] Mental Health [address]  [place], [place] [phone] or [phone]   Substance Abuse Resources Organization         Address  Phone  Notes  [facility]  [phone]   [facility]  [phone]   [facility] [place]  [phone]   Floydene Flock  [phone]   Residential & Outpatient Substance Abuse Program  [phone]   Psychological Services Organization         Address  Phone  Notes  [facility] Behavioral Health  [phone]   [facility] Services  [phone]   [facility] Mental Health [address], [place] [phone] or [phone]    Mobile Crisis Teams Organization         Address  Phone  Notes  [facility], Mobile Crisis Care Unit  [phone]   [facility]  [address] [place], [place] [phone]   Doristine Locks [address] [place] [phone]    Self-Help/Support Groups Organization         Address  Phone             Notes  [facility] [place] - variety of support groups  [phone] Call for more  information  Narcotics Anonymous (NA), [facility] [address], [employer] [place]  2 meetings at this location   Chief Executive Officer  Notes  [facility] [address],    [place]  [phone]   [facility]  [address] [place]  [address], [place], [place] [phone]   [facility] [address] [place], [place] [phone] Admissions: 8am-3pm M-F  [facility] [address]    [place], [place] [phone]   [facility] [address] [place], [place], [place] [phone]   [facility] [address],  [place], [place] [phone]   [facility] - Intensive Outpatient [address]ure[address] Joseph[address], [place], [place] [phone]   [facility] ([facility] [address] [place]  [place], [place] [phone] or [phone]   [facility] ([facility]) [address], [place], [place] [phone] Accepts Medicaid  [facility] [place] [address]  [place] [phone] Substance Abuse/Addiction Treatment   [facility] Organization         Address  Phone  Notes  [facility]  [phone]   Angie Fava, PhD [address] Ervin Knack [place], [place]   [phone] or [phone]   [facility]al   [address] [place], [place] [phone]   [facility] [address] [place], [place] [phone] Insurance/Medicaid/sponsorship through [facility] and Families [address]                                    [place], [place] [phone] Therapy/tele-psych/case  [facility] [address], [place] [phone]    Dr. Lolly Mustache  [phone]   [facility] [place]  [employer] [facility] Dept. 1) [address], [place] 2) [address] [place] 3)  [address] [place] [address], [place] [phone]  [phone]   [facility] Child Abuse Hotline 347-364-0758 or [phone] (After Hours)

## 2014-03-06 NOTE — ED Notes (Signed)
Returned from transporting a patient and it was reported to me patient left while a nurse was getting her discharge paperwork.  Upon entry into the room, gown on bed, hospital white belongings bag empty and IV was lying on a papertowel beside the bed.

## 2014-03-06 NOTE — ED Notes (Signed)
Pt c/o lower to mid back pain ongoing for 2 weeks. Pt with n/v x 2 weeks. Pt denies recent injury, reports last 2-3 days ago unable to tolerate PO food or fluids. Pt reports increase urination and watery discharge.

## 2014-03-06 NOTE — ED Notes (Signed)
Spoke with Teresa Gamble in main lab, states she will add lipase on to blood work that has been drawn.

## 2014-03-06 NOTE — ED Notes (Signed)
Pt returned from US

## 2014-03-06 NOTE — ED Notes (Signed)
Went to go get Pt's discharge papers, when I returned pt was not in room.

## 2014-03-06 NOTE — ED Notes (Signed)
Coral CeoJessica Palmer, PA notified of pt requesting pain medication.

## 2014-03-06 NOTE — ED Provider Notes (Signed)
CSN: [id number]     Arrival date & time 03/06/14  0946 History   First MD Initiated Contact with Patient 03/06/14 [phone]     Chief Complaint  Patient presents with  . Back Pain     (Consider location/radiation/quality/duration/timing/severity/associated sxs/prior Treatment) Patient is a 23 y.o. female presenting with back pain. The history is provided by the patient and medical records. No language interpreter was used.  Back Pain Associated symptoms: fever (subjective)   Associated symptoms: no abdominal pain, no chest pain, no dysuria, no headaches, no numbness and no weakness     Teresa Gamble is a 23 y.o. female  with a hx of ovarian cyst, renal stones, pyelonephritis presents to the Emergency Department complaining of gradual, persistent, progressively worsening lower back pain onset 2 weeks ago. Associated symptoms include 4 days of nausea and vomiting with subjective fevers.  She reports being unable to keep any food or fluids down. She reports emesis is nonbloody and nonbilious. Pt with Hx of nephrolithiasis and pyelonephritis; she reports pain and symptoms are similar to previous episodes.  Laying on her side makes it better and standing and walking makes it worse.  Pt denies chills, headache, neck pain, chest pain, SOB, weakness, dizziness, syncope, diarrhea.    Last menstrual period: Current   Past Medical History  Diagnosis Date  . Renal stones   . Ovarian cyst    History reviewed. No pertinent past surgical history. No family history on file. History  Substance Use Topics  . Smoking status: Former Smoker -- 0.50 packs/day for 5 years  . Smokeless tobacco: Not on file  . Alcohol Use: Yes     Comment: occasional    OB History   Grav Para Term Preterm Abortions TAB SAB Ect Mult Living                 Review of Systems  Constitutional: Positive for fever (subjective). Negative for fatigue.  Respiratory: Negative for chest tightness and shortness of breath.     Cardiovascular: Negative for chest pain.  Gastrointestinal: Positive for nausea and vomiting. Negative for abdominal pain and diarrhea.  Genitourinary: Positive for flank pain and vaginal discharge (resolved). Negative for dysuria, urgency, frequency and hematuria.  Musculoskeletal: Positive for back pain. Negative for gait problem, joint swelling, neck pain and neck stiffness.  Skin: Negative for rash.  Neurological: Negative for weakness, light-headedness, numbness and headaches.  All other systems reviewed and are negative.     Allergies  Shellfish allergy and Phenergan  Home Medications   Current Outpatient Rx  Name  Route  Sig  Dispense  Refill  . vitamin B-12 (CYANOCOBALAMIN) 1000 MCG tablet   Oral   Take 1,000 mcg by mouth daily.          BP 103/68  Pulse 69  Temp(Src) 98.1 F (36.7 C) (Oral)  Resp 15  SpO2 99%  LMP 03/06/2014 Physical Exam  Nursing note and vitals reviewed. Constitutional: She is oriented to person, place, and time. She appears well-developed and well-nourished. No distress.  Awake, alert, nontoxic appearance  HENT:  Head: Normocephalic and atraumatic.  Mouth/Throat: Oropharynx is clear and moist. No oropharyngeal exudate.  Eyes: Conjunctivae are normal. No scleral icterus.  Neck: Normal range of motion. Neck supple.  Full ROM without pain No midline or paraspinal tednderness  Cardiovascular: Normal rate, regular rhythm, normal heart sounds and intact distal pulses.   Pulmonary/Chest: Effort normal and breath sounds normal. No respiratory distress. She has no wheezes.  Abdominal: Soft. Normal appearance and bowel sounds are normal. She exhibits no distension and no mass. There is no hepatosplenomegaly. There is no tenderness. There is CVA tenderness (bilateral). There is no rebound and no guarding. Hernia confirmed negative in the right inguinal area and confirmed negative in the left inguinal area.  Genitourinary: No labial fusion. There is no  rash, tenderness, lesion or injury on the right labia. There is no rash, tenderness, lesion or injury on the left labia. Uterus is not deviated, not enlarged, not fixed and not tender. Cervix exhibits no motion tenderness, no discharge and no friability. Right adnexum displays no mass, no tenderness and no fullness. Left adnexum displays no mass, no tenderness and no fullness. There is bleeding (scant, bright red around the cervix) around the vagina. No erythema or tenderness around the vagina. No foreign body around the vagina. No signs of injury around the vagina. No vaginal discharge found.  No adnexal masses or tenderness No CMT  Musculoskeletal: Normal range of motion. She exhibits tenderness. She exhibits no edema.       Thoracic back: She exhibits tenderness and pain. She exhibits normal range of motion and no spasm.       Back:  Full range of motion of the T-spine and L-spine No tenderness to palpation of the spinous processes of the T-spine or L-spine Tenderness to palpation of the paraspinous muscles of the T-spine  Lymphadenopathy:    She has no cervical adenopathy.       Right: No inguinal adenopathy present.       Left: No inguinal adenopathy present.  Neurological: She is alert and oriented to person, place, and time. She has normal reflexes. She exhibits normal muscle tone. Coordination normal.  Speech is clear and goal oriented, follows commands Normal strength in upper and lower extremities bilaterally including dorsiflexion and plantar flexion, strong and equal grip strength Sensation normal to light and sharp touch Moves extremities without ataxia, coordination intact Normal gait Normal balance   Skin: Skin is warm and dry. No rash noted. She is not diaphoretic. No erythema.  Psychiatric: She has a normal mood and affect. Her behavior is normal.    ED Course  Procedures (including critical care time) Labs Review Labs Reviewed  WET PREP, GENITAL - Abnormal; Notable for  the following:    WBC, Wet Prep HPF POC FEW (*)    All other components within normal limits  COMPREHENSIVE METABOLIC PANEL - Abnormal; Notable for the following:    Potassium 3.4 (*)    All other components within normal limits  URINALYSIS, ROUTINE W REFLEX MICROSCOPIC - Abnormal; Notable for the following:    APPearance CLOUDY (*)    Hgb urine dipstick SMALL (*)    Ketones, ur 15 (*)    Nitrite POSITIVE (*)    Leukocytes, UA SMALL (*)    All other components within normal limits  URINE MICROSCOPIC-ADD ON - Abnormal; Notable for the following:    Squamous Epithelial / LPF FEW (*)    Bacteria, UA MANY (*)    All other components within normal limits  PREGNANCY, URINE - Abnormal; Notable for the following:    Preg Test, Ur POSITIVE (*)    All other components within normal limits  HCG, QUANTITATIVE, PREGNANCY - Abnormal; Notable for the following:    hCG, Beta Chain, Quant, S 873 (*)    All other components within normal limits  GC/CHLAMYDIA PROBE AMP  URINE CULTURE  CBC WITH DIFFERENTIAL  LIPASE, BLOOD  ABO/RH   Imaging Review No results found.   EKG Interpretation None      MDM   Final diagnoses:  Abdominal pain  Nausea and vomiting  Pregnancy   Teresa Gamble presents with c/o objective fevers, flank pain and emesis.  She reports a history of pyelonephritis and nephrolithiasis.  She reports inability to keep down food or fluids for the last 4 days.  We'll give fluid bolus, and time and neck, obtain labs, UA and repeat pelvic exam  11:19 AM Patient continues to vomit here in the emergency department will re\re dose antibiotics.  CBC without leukocytosis.  12:30 PM Pt without further emesis.  Cath urine specimen with evidence of UTI.  Concern for possible infected stone; will CT.  Pt wishes to PO trial with fluids.    2:44 PM Pregnancy test positive.  ABO/RH and hCG quant pending.  US OB pending.  Pt feeling much better, tolerating PO and without further emesis.   Vitals remain stable.    BP 106/74  Pulse 97  Temp(Src) 98.1 F (36.7 C) (Oral)  Resp 16  SpO2 100%  LMP 03/06/2014  4:15PM Pt with worsening abd pain.  US OB still pending.  Pt Rh+, no need for Rhogam.  Discussed with Junius Argyle, PA-C who will follow [place] results and dispo accordingly.  IF pt without ectopic pregnancy, she may be d/c home with treatment for UTI with expected f/u in 48 hours for hCG quant repeat.  I have discussed this plan with the patient and she is in agreement.   Teresa Client Minta Fair, PA-C 03/06/14 1829

## 2014-03-07 LAB — URINE CULTURE: Colony Count: 3000

## 2014-03-07 LAB — GC/CHLAMYDIA PROBE AMP
CT Probe RNA: NEGATIVE
GC PROBE AMP APTIMA: NEGATIVE

## 2014-03-07 NOTE — ED Provider Notes (Signed)
Medical screening examination/treatment/procedure(s) were performed by non-physician practitioner and as supervising physician I was immediately available for consultation/collaboration.   EKG Interpretation None       Glynn OctaveStephen Tykiera Raven, MD 03/07/14 1058

## 2014-03-07 NOTE — ED Provider Notes (Signed)
Medical screening examination/treatment/procedure(s) were performed by non-physician practitioner and as supervising physician I was immediately available for consultation/collaboration.   EKG Interpretation None      Katrinna Travieso, MD, FACEP   Jaan Fischel L Kaian Fahs, MD 03/07/14 0735 

## 2014-03-14 ENCOUNTER — Emergency Department (HOSPITAL_COMMUNITY): Payer: Self-pay

## 2014-03-14 ENCOUNTER — Emergency Department (HOSPITAL_COMMUNITY)
Admission: EM | Admit: 2014-03-14 | Discharge: 2014-03-14 | Disposition: A | Discharge status: Home / Self Care | Payer: Self-pay | Attending: Emergency Medicine | Admitting: Emergency Medicine

## 2014-03-14 ENCOUNTER — Encounter (HOSPITAL_COMMUNITY): Payer: Self-pay | Admitting: Emergency Medicine

## 2014-03-14 DIAGNOSIS — Z79899 Other long term (current) drug therapy: Secondary | ICD-10-CM | POA: Insufficient documentation

## 2014-03-14 DIAGNOSIS — R110 Nausea: Secondary | ICD-10-CM | POA: Insufficient documentation

## 2014-03-14 DIAGNOSIS — O219 Vomiting of pregnancy, unspecified: Secondary | ICD-10-CM | POA: Insufficient documentation

## 2014-03-14 DIAGNOSIS — R638 Other symptoms and signs concerning food and fluid intake: Secondary | ICD-10-CM | POA: Insufficient documentation

## 2014-03-14 DIAGNOSIS — O9989 Other specified diseases and conditions complicating pregnancy, childbirth and the puerperium: Secondary | ICD-10-CM | POA: Insufficient documentation

## 2014-03-14 DIAGNOSIS — Z87891 Personal history of nicotine dependence: Secondary | ICD-10-CM | POA: Insufficient documentation

## 2014-03-14 DIAGNOSIS — Z3490 Encounter for supervision of normal pregnancy, unspecified, unspecified trimester: Secondary | ICD-10-CM

## 2014-03-14 DIAGNOSIS — Z792 Long term (current) use of antibiotics: Secondary | ICD-10-CM | POA: Insufficient documentation

## 2014-03-14 DIAGNOSIS — Z87442 Personal history of urinary calculi: Secondary | ICD-10-CM | POA: Insufficient documentation

## 2014-03-14 DIAGNOSIS — M545 Low back pain, unspecified: Secondary | ICD-10-CM | POA: Insufficient documentation

## 2014-03-14 DIAGNOSIS — R112 Nausea with vomiting, unspecified: Secondary | ICD-10-CM

## 2014-03-14 LAB — COMPREHENSIVE METABOLIC PANEL
ALT: 14 U/L (ref 0–35)
AST: 14 U/L (ref 0–37)
Albumin: 4 g/dL (ref 3.5–5.2)
Alkaline Phosphatase: 60 U/L (ref 39–117)
BILIRUBIN TOTAL: 0.7 mg/dL (ref 0.3–1.2)
BUN: 9 mg/dL (ref 6–23)
CHLORIDE: 103 meq/L (ref 96–112)
CO2: 24 meq/L (ref 19–32)
Calcium: 9.4 mg/dL (ref 8.4–10.5)
Creatinine, Ser: 0.64 mg/dL (ref 0.50–1.10)
GFR calc Af Amer: >90 mL/min (ref >90)
Glucose, Bld: 86 mg/dL (ref 70–99)
Potassium: 4.1 mEq/L (ref 3.7–5.3)
Sodium: 139 mEq/L (ref 137–147)
Total Protein: 7.5 g/dL (ref 6.0–8.3)

## 2014-03-14 LAB — CBC WITH DIFFERENTIAL/PLATELET
BASOS PCT: 0 % (ref 0–1)
Basophils Absolute: 0 10*3/uL (ref 0.0–0.1)
Eosinophils Absolute: 0.2 10*3/uL (ref 0.0–0.7)
Eosinophils Relative: 3 % (ref 0–5)
HCT: 41.3 % (ref 36.0–46.0)
Hemoglobin: 13.9 g/dL (ref 12.0–15.0)
Lymphocytes Relative: 25 % (ref 12–46)
Lymphs Abs: 2 10*3/uL (ref 0.7–4.0)
MCH: 30.3 pg (ref 26.0–34.0)
MCHC: 33.7 g/dL (ref 30.0–36.0)
MCV: 90 fL (ref 78.0–100.0)
MONO ABS: 0.5 10*3/uL (ref 0.1–1.0)
MONOS PCT: 7 % (ref 3–12)
NEUTROS PCT: 65 % (ref 43–77)
Neutro Abs: 5.3 10*3/uL (ref 1.7–7.7)
Platelets: 243 10*3/uL (ref 150–400)
RBC: 4.59 MIL/uL (ref 3.87–5.11)
RDW: 13 % (ref 11.5–15.5)
WBC: 8 10*3/uL (ref 4.0–10.5)

## 2014-03-14 LAB — URINALYSIS, ROUTINE W REFLEX MICROSCOPIC
BILIRUBIN URINE: NEGATIVE
Glucose, UA: NEGATIVE mg/dL
Hgb urine dipstick: NEGATIVE
KETONES UR: NEGATIVE mg/dL
Leukocytes, UA: NEGATIVE
Nitrite: NEGATIVE
Protein, ur: NEGATIVE mg/dL
Specific Gravity, Urine: 1.021 (ref 1.005–1.030)
UROBILINOGEN UA: 0.2 mg/dL (ref 0.0–1.0)
pH: 7.5 (ref 5.0–8.0)

## 2014-03-14 LAB — ABO/RH: ABO/RH(D): O POS

## 2014-03-14 LAB — HCG, QUANTITATIVE, PREGNANCY: hCG, Beta Chain, Quant, S: 13281 m[IU]/mL — ABNORMAL HIGH (ref <5)

## 2014-03-14 MED ORDER — ONDANSETRON HCL 4 MG/2ML IJ SOLN
4.0000 mg | Freq: Once | INTRAMUSCULAR | Status: AC
  Administered 2014-03-14: 4 mg via INTRAVENOUS
  Filled 2014-03-14: qty 2

## 2014-03-14 MED ORDER — ONDANSETRON 4 MG PO TBDP
4.0000 mg | ORAL_TABLET | Freq: Once | ORAL | Status: AC
  Administered 2014-03-14: 4 mg via ORAL
  Filled 2014-03-14: qty 1

## 2014-03-14 MED ORDER — SODIUM CHLORIDE 0.9 % IV SOLN: 1000.0000 mL | INTRAVENOUS | Status: DC

## 2014-03-14 MED ORDER — ONDANSETRON HCL 4 MG PO TABS: 8.0000 mg | ORAL_TABLET | Freq: Three times a day (TID) | ORAL | Status: DC | PRN

## 2014-03-14 MED ORDER — OXYCODONE-ACETAMINOPHEN 5-325 MG PO TABS
1.0000 | ORAL_TABLET | Freq: Once | ORAL | Status: AC
  Administered 2014-03-14: 1 via ORAL
  Filled 2014-03-14: qty 1

## 2014-03-14 MED ORDER — MORPHINE SULFATE 4 MG/ML IJ SOLN
4.0000 mg | Freq: Once | INTRAMUSCULAR | Status: AC
  Administered 2014-03-14: 4 mg via INTRAVENOUS
  Filled 2014-03-14: qty 1

## 2014-03-14 MED ORDER — ONDANSETRON 4 MG PO TBDP
8.0000 mg | ORAL_TABLET | Freq: Once | ORAL | Status: AC
  Administered 2014-03-14: 8 mg via ORAL
  Filled 2014-03-14: qty 2

## 2014-03-14 MED ORDER — SODIUM CHLORIDE 0.9 % IV SOLN
1000.0000 mL | Freq: Once | INTRAVENOUS | Status: AC
  Administered 2014-03-14: 1000 mL via INTRAVENOUS

## 2014-03-14 MED ORDER — HYDROCODONE-ACETAMINOPHEN 5-325 MG PO TABS: 1.0000 | ORAL_TABLET | ORAL | Status: DC | PRN

## 2014-03-14 NOTE — ED Provider Notes (Signed)
CSN: [id number]     Arrival date & time 03/14/14  1255 History   First MD Initiated Contact with Patient 03/14/14 1618     Chief Complaint  Patient presents with  . Emesis   HPI Patient presents emergency Department with complaints of ongoing abdominal pain and possible UTI.  She states she is pregnant and her ultrasound during her last ER visit demonstrated no visible intrauterine pregnancy.  Patient reports no diarrhea.  She reports decreased oral intake over the past several days secondary to vomiting.  She is a G2 P1 A1.  No vaginal bleeding.  Some mild low back pain.  No vaginal discharge or vaginal complaints.  She has no urinary complaints at this time   Past Medical History  Diagnosis Date  . Renal stones   . Ovarian cyst    History reviewed. No pertinent past surgical history. No family history on file. History  Substance Use Topics  . Smoking status: Former Smoker -- 0.50 packs/day for 5 years  . Smokeless tobacco: Not on file  . Alcohol Use: Yes     Comment: occasional    OB History   Grav Para Term Preterm Abortions TAB SAB Ect Mult Living   1              Review of Systems  All other systems reviewed and are negative.     Allergies  Shellfish allergy and Phenergan  Home Medications   Prior to Admission medications   Medication Sig Start Date End Date Taking? Authorizing Provider  acetaminophen (TYLENOL) 325 MG tablet Take 650 mg by mouth daily as needed.   Yes Historical Provider, MD  vitamin B-12 (CYANOCOBALAMIN) 1000 MCG tablet Take 1,000 mcg by mouth daily.   Yes Historical Provider, MD  cephALEXin (KEFLEX) 500 MG capsule Take 500 mg by mouth 2 (two) times daily. 03/06/14   Jillyn LedgerJessica K Palmer, PA-C  HYDROcodone-acetaminophen (NORCO/VICODIN) 5-325 MG per tablet Take 1 tablet by mouth every 4 (four) hours as needed for moderate pain. 03/14/14   Lyanne CoKevin M Bayle Calvo, MD  ondansetron (ZOFRAN ODT) 4 MG disintegrating tablet Take 1 tablet (4 mg total) by mouth every 8  (eight) hours as needed for nausea. 03/06/14   Jillyn LedgerJessica K Palmer, PA-C  ondansetron (ZOFRAN) 4 MG tablet Take 2 tablets (8 mg total) by mouth every 8 (eight) hours as needed for nausea or vomiting. 03/14/14   Lyanne CoKevin M Rakiyah Esch, MD   BP 93/62  Pulse 83  Temp(Src) 98.2 F (36.8 C) (Oral)  Resp 22  Wt 116 lb (52.617 kg)  SpO2 100%  LMP 02/03/2014 Physical Exam  Nursing note and vitals reviewed. Constitutional: She is oriented to person, place, and time. She appears well-developed and well-nourished. No distress.  HENT:  Head: Normocephalic and atraumatic.  Eyes: EOM are normal.  Neck: Normal range of motion.  Cardiovascular: Normal rate, regular rhythm and normal heart sounds.   Pulmonary/Chest: Effort normal and breath sounds normal.  Abdominal: Soft. She exhibits no distension. There is no tenderness. There is no rebound and no guarding.  Genitourinary:  No CVA tenderness  Musculoskeletal: Normal range of motion.  Neurological: She is alert and oriented to person, place, and time.  Skin: Skin is warm and dry.  Psychiatric: She has a normal mood and affect. Judgment normal.    ED Course  Procedures (including critical care time) Labs Review Labs Reviewed  URINALYSIS, ROUTINE W REFLEX MICROSCOPIC - Abnormal; Notable for the following:    APPearance CLOUDY (*)  All other components within normal limits  HCG, QUANTITATIVE, PREGNANCY - Abnormal; Notable for the following:    hCG, Beta Chain, Quant, S 13281 (*)    All other components within normal limits  COMPREHENSIVE METABOLIC PANEL  CBC WITH DIFFERENTIAL  ABO/RH    Imaging Review [place] [facility] Transvaginal  03/14/2014   CLINICAL DATA:  Abdominal pain. Pregnant. Vomiting. Unsure LMP. Estimated gestational age by prior ultrasound is 6 weeks 2 days. Quantitative beta HCG is 13,281, rising.  EXAM: TRANSVAGINAL OB ULTRASOUND  TECHNIQUE: Transvaginal ultrasound was performed for complete evaluation of the gestation as well as the maternal  uterus, adnexal regions, and pelvic cul-de-sac.  COMPARISON:  US OB COMP LESS 14 WK dated 03/06/2014  FINDINGS: Intrauterine gestational sac: Single intrauterine gestational sac visualized.  Yolk sac:  Present  Embryo:  Present  Cardiac Activity: Visualized  Heart Rate: 108 bpm  CRL:   1.6  mm This is too small to calculate for dates.  Maternal uterus/adnexae: Uterus is slightly anteverted. No myometrial mass lesions. Both ovaries are visualized and demonstrate normal follicular changes. No abnormal adnexal masses. Corpus luteum cyst on the right. No free pelvic fluid collections.  IMPRESSION: Single living intrauterine pregnancy with early embryo identified.   Electronically Signed   By: Burman NievesWilliam  Stevens M.D.   On: 03/14/2014 21:36  I personally reviewed the imaging tests through PACS system I reviewed available ER/hospitalization records through the EMR    EKG Interpretation None      MDM   Final diagnoses:  Nausea & vomiting  Intrauterine pregnancy    Overall the patient is well-appearing.  Vital signs are stable.  She's tolerating oral fluids in the ER.  Urine is without signs of infection.  Ultrasound demonstrates single living intrauterine pregnancy.  Referral to women's outpatient center    Lyanne CoKevin M Ildefonso Keaney, MD 03/14/14 [phone]

## 2014-03-14 NOTE — ED Notes (Signed)
Pt was seen here last week for uti/early pregnancy-did not get any medications because had to leave.  Pt actively vomiting and complaining of abdominal and mid back pain

## 2014-03-14 NOTE — ED Notes (Signed)
This RN tried to obtain IV access to RAC, IV infiltrated. Attempt also made by Steward DroneBrenda, RN which infiltrated as well. Dr. Patria Maneampos made aware, given verbal order for PO fluids and zofran.

## 2014-03-14 NOTE — Discharge Instructions (Signed)
Folic Acid in Pregnancy Folic acid is a B vitamin that helps prevent neural tube defects (NTDs). The neural tube is the part of a developing baby that becomes the brain and spinal cord. When the neural tube does not close properly, a baby is born with an NTD. NTDs include spina bifida, hernia of the spinal cord, and the absence of part of, or all of the brain (anencephaly).  Take folic acid at least 4 weeks before getting pregnant and through the first 3 months of pregnancy. This is when the neural tube is developing. It is available in most multivitamins, as a folic acid-only supplement, and in some foods. Taking the right amount of folic acid before conception and during pregnancy lessens the chances of having a baby born with an NTD. Giving folic acid will not affect a neural tube defect if it is already present. DIAGNOSIS   An Alpha-Fetoprotein (AFP) blood or amniotic fluid test will show high levels of the alpha-feto protein if a woman is carrying a baby with an NTD. This test is done on all pregnant women in the first trimester.  An ultrasound may detect an NTD. WHAT YOU CAN DO:  Take a multivitamin with at least 0.4 milligrams (400 micrograms) of folic acid daily at least 4 weeks before getting pregnant and through the first 12 weeks of pregnancy.  If you have already had a pregnancy affected by an NTD, take 4 milligrams (4,000 micrograms) of folic acid daily. Take this amount 1 month before you start trying to get pregnant and continue through the first 3 months of pregnancy. If you have a seizure disorder or take medicines to control seizures, tell your maternity care provider. Continue to take your folic acid unless you are told otherwise.  FOLIC ACID IN FOODS Eat a healthy diet that has foods that contain folic acid, the natural form of the vitamin. Such foods include:  Fortified breakfast cereals.  Lentils.  Asparagus.  Spinach.  Organ meats (liver).  Black beans.  Peanuts  (eat only if you do not have a peanut allergy).  Broccoli.  Strawberries, oranges.  Orange juice (from concentrate is best).  Enriched breads and pasta.  Romaine lettuce. TALK TO YOUR HEALTH CARE PROVIDER IF:  You are in your first trimester and have high blood sugar.  You are in your first trimester and develop a high fever. In almost all cases, a fetus found to have an NTD will need specialized care that may not be available in all hospitals. Talk to your health care provider about what is best for you and your baby. Document Released: 11/14/2003 Document Revised: 09/01/2013 Document Reviewed: 02/14/2010 [facility] Patient Information 2014 [place], [place].  Pregnancy - First Trimester During sexual intercourse, millions of sperm go into the vagina. Only 1 sperm will penetrate and fertilize the female egg while it is in the Fallopian tube. One week later, the fertilized egg implants into the wall of the uterus. An embryo begins to develop into a baby. At 6 to 8 weeks, the eyes and face are formed and the heartbeat can be seen on ultrasound. At the end of 12 weeks (first trimester), all the baby's organs are formed. Now that you are pregnant, you will want to do everything you can to have a healthy baby. Two of the most important things are to get good prenatal care and follow your caregiver's instructions. Prenatal care is all the medical care you receive before the baby's birth. It is given to prevent,  find, and treat problems during the pregnancy and childbirth. PRENATAL EXAMS  During prenatal visits, your weight, blood pressure, and urine are checked. This is done to make sure you are healthy and progressing normally during the pregnancy.  A pregnant woman should gain 25 to 35 pounds during the pregnancy. However, if you are overweight or underweight, your caregiver will advise you regarding your weight.  Your caregiver will ask and answer questions for you.  Blood work, cervical  cultures, other necessary tests, and a Pap test are done during your prenatal exams. These tests are done to check on your health and the probable health of your baby. Tests are strongly recommended and done for HIV with your permission. This is the virus that causes AIDS. These tests are done because medicines can be given to help prevent your baby from being born with this infection should you have been infected without knowing it. Blood work is also used to find out your blood type, previous infections, and follow your blood levels (hemoglobin).  Low hemoglobin (anemia) is common during pregnancy. Iron and vitamins are given to help prevent this. Later in the pregnancy, blood tests for diabetes will be done along with any other tests if any problems develop.  You may need other tests to make sure you and the baby are doing well. CHANGES DURING THE FIRST TRIMESTER  Your body goes through many changes during pregnancy. They vary from person to person. Talk to your caregiver about changes you notice and are concerned about. Changes can include:  Your menstrual period stops.  The egg and sperm carry the genes that determine what you look like. Genes from you and your partner are forming a baby. The female genes determine whether the baby is a boy or a girl.  Your body increases in girth and you may feel bloated.  Feeling sick to your stomach (nauseous) and throwing up (vomiting). If the vomiting is uncontrollable, call your caregiver.  Your breasts will begin to enlarge and become tender.  Your nipples may stick out more and become darker.  The need to urinate more. Painful urination may mean you have a bladder infection.  Tiring easily.  Loss of appetite.  Cravings for certain kinds of food.  At first, you may gain or lose a couple of pounds.  You may have changes in your emotions from day to day (excited to be pregnant or concerned something may go wrong with the pregnancy and  baby).  You may have more vivid and strange dreams. HOME CARE INSTRUCTIONS   It is very important to avoid all smoking, alcohol and non-prescribed drugs during your pregnancy. These affect the formation and growth of the baby. Avoid chemicals while pregnant to ensure the delivery of a healthy infant.  Start your prenatal visits by the 12th week of pregnancy. They are usually scheduled monthly at first, then more often in the last 2 months before delivery. Keep your caregiver's appointments. Follow your caregiver's instructions regarding medicine use, blood and lab tests, exercise, and diet.  During pregnancy, you are providing food for you and your baby. Eat regular, well-balanced meals. Choose foods such as meat, fish, milk and other low fat dairy products, vegetables, fruits, and whole-grain breads and cereals. Your caregiver will tell you of the ideal weight gain.  You can help morning sickness by keeping soda crackers at the bedside. Eat a couple before arising in the morning. You may want to use the crackers without salt on them.  Eating 4 to 5 small meals rather than 3 large meals a day also may help the nausea and vomiting.  Drinking liquids between meals instead of during meals also seems to help nausea and vomiting.  A physical sexual relationship may be continued throughout pregnancy if there are no other problems. Problems may be early (premature) leaking of amniotic fluid from the membranes, vaginal bleeding, or belly (abdominal) pain.  Exercise regularly if there are no restrictions. Check with your caregiver or physical therapist if you are unsure of the safety of some of your exercises. Greater weight gain will occur in the last 2 trimesters of pregnancy. Exercising will help:  Control your weight.  Keep you in shape.  Prepare you for labor and delivery.  Help you lose your pregnancy weight after you deliver your baby.  Wear a good support or jogging bra for breast  tenderness during pregnancy. This may help if worn during sleep too.  Ask when prenatal classes are available. Begin classes when they are offered.  Do not use hot tubs, steam rooms, or saunas.  Wear your seat belt when driving. This protects you and your baby if you are in an accident.  Avoid raw meat, uncooked cheese, cat litter boxes, and soil used by cats throughout the pregnancy. These carry germs that can cause birth defects in the baby.  The first trimester is a good time to visit your dentist for your dental health. Getting your teeth cleaned is okay. Use a softer toothbrush and brush gently during pregnancy.  Ask for help if you have financial, counseling, or nutritional needs during pregnancy. Your caregiver will be able to offer counseling for these needs as well as refer you for other special needs.  Do not take any medicines or herbs unless told by your caregiver.  Inform your caregiver if there is any mental or physical domestic violence.  Make a list of emergency phone numbers of family, friends, hospital, and police and fire departments.  Write down your questions. Take them to your prenatal visit.  Do not douche.  Do not cross your legs.  If you have to stand for long periods of time, rotate you feet or take small steps in a circle.  You may have more vaginal secretions that may require a sanitary pad. Do not use tampons or scented sanitary pads. MEDICINES AND DRUG USE IN PREGNANCY  Take prenatal vitamins as directed. The vitamin should contain 1 milligram of folic acid. Keep all vitamins out of reach of children. Only a couple vitamins or tablets containing iron may be fatal to a baby or young child when ingested.  Avoid use of all medicines, including herbs, over-the-counter medicines, not prescribed or suggested by your caregiver. Only take over-the-counter or prescription medicines for pain, discomfort, or fever as directed by your caregiver. Do not use aspirin,  ibuprofen, or naproxen unless directed by your caregiver.  Let your caregiver also know about herbs you may be using.  Alcohol is related to a number of birth defects. This includes fetal alcohol syndrome. All alcohol, in any form, should be avoided completely. Smoking will cause low birth rate and premature babies.  Street or illegal drugs are very harmful to the baby. They are absolutely forbidden. A baby born to an addicted mother will be addicted at birth. The baby will go through the same withdrawal an adult does.  Let your caregiver know about any medicines that you have to take and for what reason you take them.  SEEK MEDICAL CARE IF:  You have any concerns or worries during your pregnancy. It is better to call with your questions if you feel they cannot wait, rather than worry about them. SEEK IMMEDIATE MEDICAL CARE IF:   An unexplained oral temperature above 102 F (38.9 C) develops, or as your caregiver suggests.  You have leaking of fluid from the vagina (birth canal). If leaking membranes are suspected, take your temperature and inform your caregiver of this when you call.  There is vaginal spotting or bleeding. Notify your caregiver of the amount and how many pads are used.  You develop a bad smelling vaginal discharge with a change in the color.  You continue to feel sick to your stomach (nauseated) and have no relief from remedies suggested. You vomit blood or coffee ground-like materials.  You lose more than 2 pounds of weight in 1 week.  You gain more than 2 pounds of weight in 1 week and you notice swelling of your face, hands, feet, or legs.  You gain 5 pounds or more in 1 week (even if you do not have swelling of your hands, face, legs, or feet).  You get exposed to [place] measles and have never had them.  You are exposed to fifth disease or chickenpox.  You develop belly (abdominal) pain. Round ligament discomfort is a common non-cancerous (benign) cause of  abdominal pain in pregnancy. Your caregiver still must evaluate this.  You develop headache, fever, diarrhea, pain with urination, or shortness of breath.  You fall or are in a car accident or have any kind of trauma.  There is mental or physical violence in your home. Document Released: 11/05/2001 Document Revised: 08/05/2012 Document Reviewed: 05/09/2009 [facility] Patient Information 2014 [place], [place].

## 2014-03-14 NOTE — ED Notes (Signed)
Pt transported to [facility]

## 2014-03-16 LAB — URINE CULTURE

## 2014-04-20 ENCOUNTER — Encounter: Payer: Self-pay | Admitting: Advanced Practice Midwife

## 2014-09-26 ENCOUNTER — Encounter (HOSPITAL_COMMUNITY): Payer: Self-pay | Admitting: Emergency Medicine

## 2014-10-03 ENCOUNTER — Emergency Department (HOSPITAL_BASED_OUTPATIENT_CLINIC_OR_DEPARTMENT_OTHER)
Admission: EM | Admit: 2014-10-03 | Discharge: 2014-10-03 | Disposition: A | Discharge status: Home / Self Care | Payer: Self-pay | Attending: Emergency Medicine | Admitting: Emergency Medicine

## 2014-10-03 ENCOUNTER — Encounter (HOSPITAL_BASED_OUTPATIENT_CLINIC_OR_DEPARTMENT_OTHER): Payer: Self-pay | Admitting: *Deleted

## 2014-10-03 DIAGNOSIS — Z87442 Personal history of urinary calculi: Secondary | ICD-10-CM | POA: Insufficient documentation

## 2014-10-03 DIAGNOSIS — Z792 Long term (current) use of antibiotics: Secondary | ICD-10-CM | POA: Insufficient documentation

## 2014-10-03 DIAGNOSIS — M545 Low back pain, unspecified: Secondary | ICD-10-CM

## 2014-10-03 DIAGNOSIS — R110 Nausea: Secondary | ICD-10-CM | POA: Insufficient documentation

## 2014-10-03 DIAGNOSIS — N760 Acute vaginitis: Secondary | ICD-10-CM | POA: Insufficient documentation

## 2014-10-03 DIAGNOSIS — Z79899 Other long term (current) drug therapy: Secondary | ICD-10-CM | POA: Insufficient documentation

## 2014-10-03 DIAGNOSIS — Z3202 Encounter for pregnancy test, result negative: Secondary | ICD-10-CM | POA: Insufficient documentation

## 2014-10-03 DIAGNOSIS — B9689 Other specified bacterial agents as the cause of diseases classified elsewhere: Secondary | ICD-10-CM

## 2014-10-03 DIAGNOSIS — Z87891 Personal history of nicotine dependence: Secondary | ICD-10-CM | POA: Insufficient documentation

## 2014-10-03 LAB — URINALYSIS, ROUTINE W REFLEX MICROSCOPIC
BILIRUBIN URINE: NEGATIVE
GLUCOSE, UA: NEGATIVE mg/dL
HGB URINE DIPSTICK: NEGATIVE
KETONES UR: NEGATIVE mg/dL
Leukocytes, UA: NEGATIVE
Nitrite: NEGATIVE
PROTEIN: NEGATIVE mg/dL
Specific Gravity, Urine: 1.021 (ref 1.005–1.030)
Urobilinogen, UA: 0.2 mg/dL (ref 0.0–1.0)
pH: 7 (ref 5.0–8.0)

## 2014-10-03 LAB — WET PREP, GENITAL
Trich, Wet Prep: NONE SEEN
Yeast Wet Prep HPF POC: NONE SEEN

## 2014-10-03 LAB — PREGNANCY, URINE: Preg Test, Ur: NEGATIVE

## 2014-10-03 MED ORDER — METRONIDAZOLE 500 MG PO TABS: 500.0000 mg | ORAL_TABLET | Freq: Two times a day (BID) | ORAL | Status: DC

## 2014-10-03 NOTE — Discharge Instructions (Signed)
Please read and follow all provided instructions.  Your diagnoses today include:  1. Bilateral low back pain without sciatica   2. Bacterial vaginosis     Tests performed today include:  Urine test to look for infection and pregnancy (in women) - normal  Wet prep - shows bacterial vaginosis  Vital signs. See below for your results today.   Medications prescribed:   Metronidazole - antibiotic  You have been prescribed an antibiotic medicine: take the entire course of medicine even if you are feeling better. Stopping early can cause the antibiotic not to work. Do not drink alcohol when taking this medication.   Take any prescribed medications only as directed.  Home care instructions:   Follow any educational materials contained in this packet.  Follow-up instructions: Please follow-up with your primary care provider/gynecologist in the next 7 days for further evaluation of your symptoms.    Return instructions:  SEEK IMMEDIATE MEDICAL ATTENTION IF:  The pain does not go away or becomes severe   A temperature above 101F develops   Repeated vomiting occurs (multiple episodes)   The pain becomes localized to portions of the abdomen. The right side could possibly be appendicitis. In an adult, the left lower portion of the abdomen could be colitis or diverticulitis.   Blood is being passed in stools or vomit (bright red or black tarry stools)   You develop chest pain, difficulty breathing, dizziness or fainting, or become confused, poorly responsive, or inconsolable (young children)  If you have any other emergent concerns regarding your health  Additional Information: Abdominal (belly) pain can be caused by many things. Your caregiver performed an examination and possibly ordered blood/urine tests and imaging (CT scan, x-rays, ultrasound). Many cases can be observed and treated at home after initial evaluation in the emergency department. Even though you are being  discharged home, abdominal pain can be unpredictable. Therefore, you need a repeated exam if your pain does not resolve, returns, or worsens. Most patients with abdominal pain don't have to be admitted to the hospital or have surgery, but serious problems like appendicitis and gallbladder attacks can start out as nonspecific pain. Many abdominal conditions cannot be diagnosed in one visit, so follow-up evaluations are very important.  Your vital signs today were: BP 112/66 mmHg   Pulse 80   Temp(Src) 98.1 F (36.7 C) (Oral)   Resp 20   Ht 5\' 3"  (1.6 m)   Wt 160 lb (72.576 kg)   BMI 28.35 kg/m2   SpO2 100%   LMP 02/03/2014   Breastfeeding? Unknown If your blood pressure (bp) was elevated above 135/85 this visit, please have this repeated by your doctor within one month. --------------

## 2014-10-03 NOTE — ED Notes (Signed)
PA at bedside.

## 2014-10-03 NOTE — ED Provider Notes (Signed)
CSN: [id number]     Arrival date & time 10/03/14  1532 History   First MD Initiated Contact with Patient 10/03/14 1653     Chief Complaint  Patient presents with  . Back Pain  . Nausea     (Consider location/radiation/quality/duration/timing/severity/associated sxs/prior Treatment) HPI Comments: Patient with history of IUD, ovarian cyst -- presents with c/o L back pain and suprapubic tenderness x 3 days. She has not had fever. No vomiting but has felt nauseous today. She is concerned that her IUD is causing symptoms. She has had frequent episodes of lower abdominal soreness since having her IUD placed. No diarrhea or change in BM. Pain came on gradually. No vaginal bleeding or discharge. She has had one sexual partner x 3 years and states she is not concerned about STI.   The history is provided by the patient.    Past Medical History  Diagnosis Date  . Renal stones   . Ovarian cyst    History reviewed. No pertinent past surgical history. No family history on file. History  Substance Use Topics  . Smoking status: Former Smoker -- 0.50 packs/day for 5 years  . Smokeless tobacco: Not on file  . Alcohol Use: Yes     Comment: occasional    OB History    Gravida Para Term Preterm AB TAB SAB Ectopic Multiple Living   1              Review of Systems  Constitutional: Negative for fever.  HENT: Negative for rhinorrhea and sore throat.   Eyes: Negative for redness.  Respiratory: Negative for cough.   Cardiovascular: Negative for chest pain.  Gastrointestinal: Positive for nausea. Negative for vomiting, abdominal pain and diarrhea.  Genitourinary: Positive for dysuria. Negative for frequency, hematuria, flank pain, decreased urine volume, vaginal bleeding and vaginal discharge.  Musculoskeletal: Negative for myalgias.  Skin: Negative for rash.  Neurological: Negative for headaches.    Allergies  Shellfish allergy and Phenergan  Home Medications   Prior to Admission  medications   Medication Sig Start Date End Date Taking? Authorizing Provider  Multiple Vitamin (MULTIVITAMIN) tablet Take 1 tablet by mouth daily.   Yes Historical Provider, MD  acetaminophen (TYLENOL) 325 MG tablet Take 650 mg by mouth daily as needed.    Historical Provider, MD  cephALEXin (KEFLEX) 500 MG capsule Take 500 mg by mouth 2 (two) times daily. 03/06/14   Jillyn LedgerJessica K Palmer, PA-C  HYDROcodone-acetaminophen (NORCO/VICODIN) 5-325 MG per tablet Take 1 tablet by mouth every 4 (four) hours as needed for moderate pain. 03/14/14   Lyanne CoKevin M Campos, MD  ondansetron (ZOFRAN ODT) 4 MG disintegrating tablet Take 1 tablet (4 mg total) by mouth every 8 (eight) hours as needed for nausea. 03/06/14   Jillyn LedgerJessica K Palmer, PA-C  ondansetron (ZOFRAN) 4 MG tablet Take 2 tablets (8 mg total) by mouth every 8 (eight) hours as needed for nausea or vomiting. 03/14/14   Lyanne CoKevin M Campos, MD  vitamin B-12 (CYANOCOBALAMIN) 1000 MCG tablet Take 1,000 mcg by mouth daily.    Historical Provider, MD   BP 112/66 mmHg  Pulse 80  Temp(Src) 98.1 F (36.7 C) (Oral)  Resp 20  Ht 5\' 3"  (1.6 m)  Wt 160 lb (72.576 kg)  BMI 28.35 kg/m2  SpO2 100%  LMP 02/03/2014  Breastfeeding? Unknown   Physical Exam  Constitutional: She appears well-developed and well-nourished.  HENT:  Head: Normocephalic and atraumatic.  Eyes: Conjunctivae are normal. Right eye exhibits no discharge. Left eye  exhibits no discharge.  Neck: Normal range of motion. Neck supple.  Cardiovascular: Normal rate, regular rhythm and normal heart sounds.   Pulmonary/Chest: Effort normal and breath sounds normal.  Abdominal: Soft. Bowel sounds are normal. She exhibits no distension. There is tenderness (mild) in the suprapubic area. There is no rebound, no guarding and no CVA tenderness.  Genitourinary: There is no lesion on the right labia. There is no lesion on the left labia. Uterus is tender (mild). Uterus is not enlarged. Cervix exhibits no motion tenderness  and no discharge. Right adnexum displays tenderness (mild). Right adnexum displays no mass and no fullness. Left adnexum displays tenderness (mild). Left adnexum displays no mass and no fullness. No erythema, tenderness or bleeding in the vagina. Vaginal discharge (thick white) found.  Neurological: She is alert.  Skin: Skin is warm and dry.  Psychiatric: She has a normal mood and affect.  Nursing note and vitals reviewed.   ED Course  Procedures (including critical care time) Labs Review Labs Reviewed  WET PREP, GENITAL - Abnormal; Notable for the following:    Clue Cells Wet Prep HPF POC MODERATE (*)    WBC, Wet Prep HPF POC FEW (*)    All other components within normal limits  GC/CHLAMYDIA PROBE AMP  URINALYSIS, ROUTINE W REFLEX MICROSCOPIC  PREGNANCY, URINE    Imaging Review No results found.   EKG Interpretation None       5:37 PM Patient seen and examined. Work-up initiated. Will perform pelvic exam. Pt informed of neg UA results.   Vital signs reviewed and are as follows: BP 112/66 mmHg  Pulse 80  Temp(Src) 98.1 F (36.7 C) (Oral)  Resp 20  Ht 5\' 3"  (1.6 m)  Wt 160 lb (72.576 kg)  BMI 28.35 kg/m2  SpO2 100%  LMP 02/03/2014  Breastfeeding? Unknown  6:41 PM Pelvic exam performed with nurse chaperone. Patient is anxious to go home. Wet prep suggests BV. Will treat.  The patient was urged to return to the Emergency Department immediately with worsening of current symptoms, worsening abdominal pain, persistent vomiting, blood noted in stools, fever, or any other concerns. The patient verbalized understanding.    MDM   Final diagnoses:  Bilateral low back pain without sciatica  Bacterial vaginosis   Patient with gradual onset of vague lower abdominal and back pain. UA neg. UPT neg. Exam shows some white discharge with BV on wet prep. I do not suspect uterus compromise from IUD at this point. She has generalized mild tenderness on exam. I do not suspect torsion.  Doubt STI. Patient needs to follow-up with her GYN. Patient informed of s/s to return.   No dangerous or life-threatening conditions suspected or identified by history, physical exam, and by work-up. No indications for hospitalization identified.      Renne CriglerJoshua Irina Okelly, PA-C 10/03/14 1848  Layla MawKristen N Ward, DO 10/03/14 2352

## 2014-10-03 NOTE — ED Notes (Signed)
Back pain and dysuria x 3 days. Nausea today.

## 2014-10-04 LAB — GC/CHLAMYDIA PROBE AMP
CT PROBE, AMP APTIMA: NEGATIVE
GC PROBE AMP APTIMA: NEGATIVE

## 2015-01-09 ENCOUNTER — Emergency Department (HOSPITAL_BASED_OUTPATIENT_CLINIC_OR_DEPARTMENT_OTHER)
Admission: EM | Admit: 2015-01-09 | Discharge: 2015-01-09 | Disposition: A | Discharge status: Home / Self Care | Payer: 59 | Attending: Emergency Medicine | Admitting: Emergency Medicine

## 2015-01-09 ENCOUNTER — Emergency Department (HOSPITAL_BASED_OUTPATIENT_CLINIC_OR_DEPARTMENT_OTHER): Payer: 59

## 2015-01-09 ENCOUNTER — Encounter (HOSPITAL_BASED_OUTPATIENT_CLINIC_OR_DEPARTMENT_OTHER): Payer: Self-pay | Admitting: *Deleted

## 2015-01-09 DIAGNOSIS — J069 Acute upper respiratory infection, unspecified: Secondary | ICD-10-CM | POA: Insufficient documentation

## 2015-01-09 DIAGNOSIS — Z87891 Personal history of nicotine dependence: Secondary | ICD-10-CM | POA: Diagnosis not present

## 2015-01-09 DIAGNOSIS — Z8742 Personal history of other diseases of the female genital tract: Secondary | ICD-10-CM | POA: Diagnosis not present

## 2015-01-09 DIAGNOSIS — Z79899 Other long term (current) drug therapy: Secondary | ICD-10-CM | POA: Insufficient documentation

## 2015-01-09 DIAGNOSIS — Z87442 Personal history of urinary calculi: Secondary | ICD-10-CM | POA: Insufficient documentation

## 2015-01-09 DIAGNOSIS — Z792 Long term (current) use of antibiotics: Secondary | ICD-10-CM | POA: Insufficient documentation

## 2015-01-09 DIAGNOSIS — R05 Cough: Secondary | ICD-10-CM | POA: Diagnosis present

## 2015-01-09 NOTE — Discharge Instructions (Signed)
Cough, Adult ° A cough is a reflex that helps clear your throat and airways. It can help heal the body or may be a reaction to an irritated airway. A cough may only last 2 or 3 weeks (acute) or may last more than 8 weeks (chronic).  °CAUSES °Acute cough: °· Viral or bacterial infections. °Chronic cough: °· Infections. °· Allergies. °· Asthma. °· Post-nasal drip. °· Smoking. °· Heartburn or acid reflux. °· Some medicines. °· Chronic lung problems (COPD). °· Cancer. °SYMPTOMS  °· Cough. °· Fever. °· Chest pain. °· Increased breathing rate. °· High-pitched whistling sound when breathing (wheezing). °· Colored mucus that you cough up (sputum). °TREATMENT  °· A bacterial cough may be treated with antibiotic medicine. °· A viral cough must run its course and will not respond to antibiotics. °· Your caregiver may recommend other treatments if you have a chronic cough. °HOME CARE INSTRUCTIONS  °· Only take over-the-counter or prescription medicines for pain, discomfort, or fever as directed by your caregiver. Use cough suppressants only as directed by your caregiver. °· Use a cold steam vaporizer or humidifier in your bedroom or home to help loosen secretions. °· Sleep in a semi-upright position if your cough is worse at night. °· Rest as needed. °· Stop smoking if you smoke. °SEEK IMMEDIATE MEDICAL CARE IF:  °· You have pus in your sputum. °· Your cough starts to worsen. °· You cannot control your cough with suppressants and are losing sleep. °· You begin coughing up blood. °· You have difficulty breathing. °· You develop pain which is getting worse or is uncontrolled with medicine. °· You have a fever. °MAKE SURE YOU:  °· Understand these instructions. °· Will watch your condition. °· Will get help right away if you are not doing well or get worse. °Document Released: 05/10/2011 Document Revised: 02/03/2012 Document Reviewed: 05/10/2011 °ExitCare® Patient Information ©2015 [employer]. This information is not intended  to replace advice given to you by your health care provider. Make sure you discuss any questions you have with your health care provider. °Upper Respiratory Infection, Adult °An upper respiratory infection (URI) is also sometimes known as the common cold. The upper respiratory tract includes the nose, sinuses, throat, trachea, and bronchi. Bronchi are the airways leading to the lungs. Most people improve within 1 week, but symptoms can last up to 2 weeks. A residual cough may last even longer.  °CAUSES °Many different viruses can infect the tissues lining the upper respiratory tract. The tissues become irritated and inflamed and often become very moist. Mucus production is also common. A cold is contagious. You can easily spread the virus to others by oral contact. This includes kissing, sharing a glass, coughing, or sneezing. Touching your mouth or nose and then touching a surface, which is then touched by another person, can also spread the virus. °SYMPTOMS  °Symptoms typically develop 1 to 3 days after you come in contact with a cold virus. Symptoms vary from person to person. They may include: °· Runny nose. °· Sneezing. °· Nasal congestion. °· Sinus irritation. °· Sore throat. °· Loss of voice (laryngitis). °· Cough. °· Fatigue. °· Muscle aches. °· Loss of appetite. °· Headache. °· Low-grade fever. °DIAGNOSIS  °You might diagnose your own cold based on familiar symptoms, since most people get a cold 2 to 3 times a year. Your caregiver can confirm this based on your exam. Most importantly, your caregiver can check that your symptoms are not due to another disease such   as strep throat, sinusitis, pneumonia, asthma, or epiglottitis. Blood tests, throat tests, and X-rays are not necessary to diagnose a common cold, but they may sometimes be helpful in excluding other more serious diseases. Your caregiver will decide if any further tests are required. °RISKS AND COMPLICATIONS  °You may be at risk for a more severe  case of the common cold if you smoke cigarettes, have chronic heart disease (such as heart failure) or lung disease (such as asthma), or if you have a weakened immune system. The very young and very old are also at risk for more serious infections. Bacterial sinusitis, middle ear infections, and bacterial pneumonia can complicate the common cold. The common cold can worsen asthma and chronic obstructive pulmonary disease (COPD). Sometimes, these complications can require emergency medical care and may be life-threatening. °PREVENTION  °The best way to protect against getting a cold is to practice good hygiene. Avoid oral or hand contact with people with cold symptoms. Wash your hands often if contact occurs. There is no clear evidence that vitamin C, vitamin E, echinacea, or exercise reduces the chance of developing a cold. However, it is always recommended to get plenty of rest and practice good nutrition. °TREATMENT  °Treatment is directed at relieving symptoms. There is no cure. Antibiotics are not effective, because the infection is caused by a virus, not by bacteria. Treatment may include: °· Increased fluid intake. Sports drinks offer valuable electrolytes, sugars, and fluids. °· Breathing heated mist or steam (vaporizer or shower). °· Eating chicken soup or other clear broths, and maintaining good nutrition. °· Getting plenty of rest. °· Using gargles or lozenges for comfort. °· Controlling fevers with ibuprofen or acetaminophen as directed by your caregiver. °· Increasing usage of your inhaler if you have asthma. °Zinc gel and zinc lozenges, taken in the first 24 hours of the common cold, can shorten the duration and lessen the severity of symptoms. Pain medicines may help with fever, muscle aches, and throat pain. A variety of non-prescription medicines are available to treat congestion and runny nose. Your caregiver can make recommendations and may suggest nasal or lung inhalers for other symptoms.  °HOME  CARE INSTRUCTIONS  °· Only take over-the-counter or prescription medicines for pain, discomfort, or fever as directed by your caregiver. °· Use a warm mist humidifier or inhale steam from a shower to increase air moisture. This may keep secretions moist and make it easier to breathe. °· Drink enough water and fluids to keep your urine clear or pale yellow. °· Rest as needed. °· Return to work when your temperature has returned to normal or as your caregiver advises. You may need to stay home longer to avoid infecting others. You can also use a face mask and careful hand washing to prevent spread of the virus. °SEEK MEDICAL CARE IF:  °· After the first few days, you feel you are getting worse rather than better. °· You need your caregiver's advice about medicines to control symptoms. °· You develop chills, worsening shortness of breath, or brown or red sputum. These may be signs of pneumonia. °· You develop yellow or brown nasal discharge or pain in the face, especially when you bend forward. These may be signs of sinusitis. °· You develop a fever, swollen neck glands, pain with swallowing, or white areas in the back of your throat. These may be signs of strep throat. °SEEK IMMEDIATE MEDICAL CARE IF:  °· You have a fever. °· You develop severe or persistent headache, ear   pain, sinus pain, or chest pain. °· You develop wheezing, a prolonged cough, cough up blood, or have a change in your usual mucus (if you have chronic lung disease). °· You develop sore muscles or a stiff neck. °Document Released: 05/07/2001 Document Revised: 02/03/2012 Document Reviewed: 02/16/2014 °ExitCare® Patient Information ©2015 [employer]. This information is not intended to replace advice given to you by your health care provider. Make sure you discuss any questions you have with your health care provider. ° °

## 2015-01-09 NOTE — ED Provider Notes (Signed)
CSN: [id number]     Arrival date & time 01/09/15  1205 History   First MD Initiated Contact with Patient 01/09/15 1215     Chief Complaint  Patient presents with  . Cough  . Chest Pain     (Consider location/radiation/quality/duration/timing/severity/associated sxs/prior Treatment) Patient is a 24 y.o. female presenting with cough.  Cough Cough characteristics:  Productive Sputum characteristics:  Yellow Severity:  Moderate Onset quality:  Gradual Duration:  4 days Timing:  Constant Progression:  Unchanged Chronicity:  New Smoker: no   Context: upper respiratory infection   Relieved by:  Nothing Worsened by:  Nothing tried Ineffective treatments:  None tried Associated symptoms: chills, sinus congestion and sore throat   Associated symptoms: no chest pain, no fever and no shortness of breath     Past Medical History  Diagnosis Date  . Renal stones   . Ovarian cyst    History reviewed. No pertinent past surgical history. No family history on file. History  Substance Use Topics  . Smoking status: Former Smoker -- 0.50 packs/day for 5 years  . Smokeless tobacco: Not on file  . Alcohol Use: Yes     Comment: occasional    OB History    Gravida Para Term Preterm AB TAB SAB Ectopic Multiple Living   1              Review of Systems  Constitutional: Positive for chills. Negative for fever.  HENT: Positive for sore throat.   Respiratory: Positive for cough. Negative for shortness of breath.   Cardiovascular: Negative for chest pain.  All other systems reviewed and are negative.     Allergies  Shellfish allergy and Phenergan  Home Medications   Prior to Admission medications   Medication Sig Start Date End Date Taking? Authorizing Provider  acetaminophen (TYLENOL) 325 MG tablet Take 650 mg by mouth daily as needed.    Historical Provider, MD  cephALEXin (KEFLEX) 500 MG capsule Take 500 mg by mouth 2 (two) times daily. 03/06/14   Jillyn LedgerJessica K Palmer, PA-C   HYDROcodone-acetaminophen (NORCO/VICODIN) 5-325 MG per tablet Take 1 tablet by mouth every 4 (four) hours as needed for moderate pain. 03/14/14   Lyanne CoKevin M Campos, MD  metroNIDAZOLE (FLAGYL) 500 MG tablet Take 1 tablet (500 mg total) by mouth 2 (two) times daily. 10/03/14   Renne CriglerJoshua Geiple, PA-C  Multiple Vitamin (MULTIVITAMIN) tablet Take 1 tablet by mouth daily.    Historical Provider, MD  ondansetron (ZOFRAN ODT) 4 MG disintegrating tablet Take 1 tablet (4 mg total) by mouth every 8 (eight) hours as needed for nausea. 03/06/14   Jillyn LedgerJessica K Palmer, PA-C  ondansetron (ZOFRAN) 4 MG tablet Take 2 tablets (8 mg total) by mouth every 8 (eight) hours as needed for nausea or vomiting. 03/14/14   Lyanne CoKevin M Campos, MD  vitamin B-12 (CYANOCOBALAMIN) 1000 MCG tablet Take 1,000 mcg by mouth daily.    Historical Provider, MD   BP 110/70 mmHg  Pulse 89  Temp(Src) 98.4 F (36.9 C) (Oral)  Resp 18  Ht 5\' 3"  (1.6 m)  Wt 160 lb (72.576 kg)  BMI 28.35 kg/m2  SpO2 97%  LMP 01/09/2015 Physical Exam  Constitutional: She is oriented to person, place, and time. She appears well-developed and well-nourished.  HENT:  Head: Normocephalic and atraumatic.  Right Ear: External ear normal.  Left Ear: External ear normal.  Eyes: Conjunctivae and EOM are normal. Pupils are equal, round, and reactive to light.  Neck: Normal range of motion.  Neck supple.  Cardiovascular: Normal rate, regular rhythm, normal heart sounds and intact distal pulses.   Pulmonary/Chest: Effort normal and breath sounds normal.  Abdominal: Soft. Bowel sounds are normal. There is no tenderness.  Musculoskeletal: Normal range of motion.  Neurological: She is alert and oriented to person, place, and time.  Skin: Skin is warm and dry.  Vitals reviewed.   ED Course  Procedures (including critical care time) Labs Review Labs Reviewed - No data to display  Imaging Review Dg Chest 2 View  01/09/2015   CLINICAL DATA:  Cough and chest congestion chest  pain and shortness of breath for 4 days.  EXAM: CHEST  2 VIEW  COMPARISON:  09/08/2013  FINDINGS: Heart size and pulmonary vascularity are normal. There is slight peribronchial thickening. Lungs are otherwise clear. No effusions. No osseous abnormality.  IMPRESSION: Slight bronchitic changes.   Electronically Signed   By: Francene Boyers M.D.   On: 01/09/2015 12:39     EKG Interpretation   Date/Time:  Monday January 09 2015 12:08:42 EST Ventricular Rate:  108 PR Interval:  128 QRS Duration: 86 QT Interval:  340 QTC Calculation: 455 R Axis:   133 Text Interpretation:  Sinus tachycardia Right axis deviation Abnormal ECG  No significant change since last tracing Confirmed by Mirian Mo  [phone]) on 01/09/2015 12:18:56 PM      MDM   Final diagnoses:  Viral upper respiratory infection    24 y.o. female with pertinent PMH of nephrolithiasis presents with likely viral URI.  No chest pain, pt has mild sore throat only present at night, and symptoms are primarily present at night.  Consistent with PND.  Physical exam benign.  CXR with bronchitis.  EKG initially with very mild tachycardia, not present on my exam or on subsequent vitals.  No chest pain or dyspnea, and no other risk factors to suggest PE.  DC home with standard return precautions.    I have reviewed all laboratory and imaging studies if ordered as above  1. Viral upper respiratory infection         Mirian Mo, MD 01/09/15 1248

## 2015-01-09 NOTE — ED Notes (Signed)
Cough x 4 days. In the mornings it is yellow. Burning in the upper center of her chest when she breathes in. States it feels raw.

## 2015-01-10 ENCOUNTER — Emergency Department (HOSPITAL_COMMUNITY)
Admission: EM | Admit: 2015-01-10 | Discharge: 2015-01-10 | Disposition: A | Discharge status: Home / Self Care | Payer: 59 | Attending: Emergency Medicine | Admitting: Emergency Medicine

## 2015-01-10 ENCOUNTER — Encounter (HOSPITAL_COMMUNITY): Payer: Self-pay | Admitting: Emergency Medicine

## 2015-01-10 DIAGNOSIS — Z8742 Personal history of other diseases of the female genital tract: Secondary | ICD-10-CM | POA: Insufficient documentation

## 2015-01-10 DIAGNOSIS — Z79899 Other long term (current) drug therapy: Secondary | ICD-10-CM | POA: Diagnosis not present

## 2015-01-10 DIAGNOSIS — H9203 Otalgia, bilateral: Secondary | ICD-10-CM | POA: Insufficient documentation

## 2015-01-10 DIAGNOSIS — Z87891 Personal history of nicotine dependence: Secondary | ICD-10-CM | POA: Diagnosis not present

## 2015-01-10 DIAGNOSIS — J209 Acute bronchitis, unspecified: Secondary | ICD-10-CM | POA: Insufficient documentation

## 2015-01-10 DIAGNOSIS — Z792 Long term (current) use of antibiotics: Secondary | ICD-10-CM | POA: Diagnosis not present

## 2015-01-10 DIAGNOSIS — R05 Cough: Secondary | ICD-10-CM | POA: Diagnosis present

## 2015-01-10 LAB — RAPID STREP SCREEN (MED CTR MEBANE ONLY): Streptococcus, Group A Screen (Direct): NEGATIVE

## 2015-01-10 MED ORDER — PREDNISONE 20 MG PO TABS: ORAL_TABLET | ORAL | Status: DC

## 2015-01-10 MED ORDER — IBUPROFEN 100 MG/5ML PO SUSP
400.0000 mg | Freq: Once | ORAL | Status: AC
  Administered 2015-01-10: 400 mg via ORAL
  Filled 2015-01-10: qty 20

## 2015-01-10 MED ORDER — BENZONATATE 100 MG PO CAPS: 100.0000 mg | ORAL_CAPSULE | Freq: Three times a day (TID) | ORAL | Status: DC

## 2015-01-10 MED ORDER — HYDROCOD POLST-CHLORPHEN POLST 10-8 MG/5ML PO LQCR: 5.0000 mL | Freq: Two times a day (BID) | ORAL | Status: DC | PRN

## 2015-01-10 MED ORDER — ALBUTEROL SULFATE HFA 108 (90 BASE) MCG/ACT IN AERS
2.0000 | INHALATION_SPRAY | RESPIRATORY_TRACT | Status: DC | PRN
Start: 2015-01-10 — End: 2015-01-10
  Administered 2015-01-10: 2 via RESPIRATORY_TRACT
  Filled 2015-01-10: qty 6.7

## 2015-01-10 MED ORDER — HYDROCODONE-ACETAMINOPHEN 7.5-325 MG/15ML PO SOLN
10.0000 mL | Freq: Once | ORAL | Status: AC
  Administered 2015-01-10: 10 mL via ORAL
  Filled 2015-01-10: qty 15

## 2015-01-10 MED ORDER — SALINE SPRAY 0.65 % NA SOLN: 1.0000 | NASAL | Status: DC | PRN

## 2015-01-10 MED ORDER — PREDNISONE 20 MG PO TABS
60.0000 mg | ORAL_TABLET | Freq: Once | ORAL | Status: AC
  Administered 2015-01-10: 60 mg via ORAL
  Filled 2015-01-10: qty 3

## 2015-01-10 NOTE — ED Notes (Addendum)
Pt has had URI x 4 days with fever, chills, sore throat, cough w/ yellow sputum. States went to another provider yesterday but did not receive any meds. Pt feels like she is getting worse.

## 2015-01-10 NOTE — Discharge Instructions (Signed)
Cough, Adult  A cough is a reflex that helps clear your throat and airways. It can help heal the body or may be a reaction to an irritated airway. A cough may only last 2 or 3 weeks (acute) or may last more than 8 weeks (chronic).  CAUSES Acute cough:  Viral or bacterial infections. Chronic cough:  Infections.  Allergies.  Asthma.  Post-nasal drip.  Smoking.  Heartburn or acid reflux.  Some medicines.  Chronic lung problems (COPD).  Cancer. SYMPTOMS   Cough.  Fever.  Chest pain.  Increased breathing rate.  High-pitched whistling sound when breathing (wheezing).  Colored mucus that you cough up (sputum). TREATMENT   A bacterial cough may be treated with antibiotic medicine.  A viral cough must run its course and will not respond to antibiotics.  Your caregiver may recommend other treatments if you have a chronic cough. HOME CARE INSTRUCTIONS   Only take over-the-counter or prescription medicines for pain, discomfort, or fever as directed by your caregiver. Use cough suppressants only as directed by your caregiver.  Use a cold steam vaporizer or humidifier in your bedroom or home to help loosen secretions.  Sleep in a semi-upright position if your cough is worse at night.  Rest as needed.  Stop smoking if you smoke. SEEK IMMEDIATE MEDICAL CARE IF:   You have pus in your sputum.  Your cough starts to worsen.  You cannot control your cough with suppressants and are losing sleep.  You begin coughing up blood.  You have difficulty breathing.  You develop pain which is getting worse or is uncontrolled with medicine.  You have a fever. MAKE SURE YOU:   Understand these instructions.  Will watch your condition.  Will get help right away if you are not doing well or get worse. Document Released: 05/10/2011 Document Revised: 02/03/2012 Document Reviewed: 05/10/2011 [employer] Patient Information 2015 [employer]. This information is not intended  to replace advice given to you by your health care provider. Make sure you discuss any questions you have with your health care provider.  
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
## 2015-01-10 NOTE — ED Notes (Signed)
Pt sts cough and pain with cough and body aches, low grade fever x several days; [facility]

## 2015-01-12 LAB — CULTURE, GROUP A STREP

## 2015-02-07 ENCOUNTER — Emergency Department (HOSPITAL_COMMUNITY): Payer: 59

## 2015-02-07 ENCOUNTER — Encounter (HOSPITAL_COMMUNITY): Payer: Self-pay | Admitting: Emergency Medicine

## 2015-02-07 ENCOUNTER — Observation Stay (HOSPITAL_COMMUNITY)
Admission: EM | Admit: 2015-02-07 | Discharge: 2015-02-09 | Disposition: A | Discharge status: Home / Self Care | Payer: 59 | Attending: General Surgery | Admitting: General Surgery

## 2015-02-07 DIAGNOSIS — Z87891 Personal history of nicotine dependence: Secondary | ICD-10-CM | POA: Diagnosis not present

## 2015-02-07 DIAGNOSIS — D72829 Elevated white blood cell count, unspecified: Secondary | ICD-10-CM | POA: Diagnosis not present

## 2015-02-07 DIAGNOSIS — T148 Other injury of unspecified body region: Secondary | ICD-10-CM | POA: Diagnosis not present

## 2015-02-07 DIAGNOSIS — S36520A Contusion of ascending [right] colon, initial encounter: Secondary | ICD-10-CM | POA: Diagnosis not present

## 2015-02-07 DIAGNOSIS — S36113A Laceration of liver, unspecified degree, initial encounter: Secondary | ICD-10-CM | POA: Diagnosis present

## 2015-02-07 DIAGNOSIS — S36115A Moderate laceration of liver, initial encounter: Secondary | ICD-10-CM | POA: Diagnosis present

## 2015-02-07 DIAGNOSIS — D62 Acute posthemorrhagic anemia: Secondary | ICD-10-CM | POA: Insufficient documentation

## 2015-02-07 DIAGNOSIS — T07XXXA Unspecified multiple injuries, initial encounter: Secondary | ICD-10-CM

## 2015-02-07 DIAGNOSIS — R110 Nausea: Secondary | ICD-10-CM | POA: Diagnosis not present

## 2015-02-07 LAB — COMPREHENSIVE METABOLIC PANEL
ALT: 54 U/L — ABNORMAL HIGH (ref 0–35)
AST: 42 U/L — AB (ref 0–37)
Albumin: 4 g/dL (ref 3.5–5.2)
Alkaline Phosphatase: 53 U/L (ref 39–117)
Anion gap: 7 (ref 5–15)
BUN: 14 mg/dL (ref 6–23)
CO2: 23 mmol/L (ref 19–32)
CREATININE: 0.83 mg/dL (ref 0.50–1.10)
Calcium: 8.9 mg/dL (ref 8.4–10.5)
Chloride: 108 mmol/L (ref 96–112)
GFR calc Af Amer: >90 mL/min (ref >90)
Glucose, Bld: 109 mg/dL — ABNORMAL HIGH (ref 70–99)
Potassium: 4.5 mmol/L (ref 3.5–5.1)
Sodium: 138 mmol/L (ref 135–145)
Total Bilirubin: 0.4 mg/dL (ref 0.3–1.2)
Total Protein: 6.4 g/dL (ref 6.0–8.3)

## 2015-02-07 LAB — SAMPLE TO BLOOD BANK

## 2015-02-07 LAB — CBC
HCT: 43 % (ref 36.0–46.0)
HEMOGLOBIN: 14.1 g/dL (ref 12.0–15.0)
MCH: 29.7 pg (ref 26.0–34.0)
MCHC: 32.8 g/dL (ref 30.0–36.0)
MCV: 90.5 fL (ref 78.0–100.0)
Platelets: 195 10*3/uL (ref 150–400)
RBC: 4.75 MIL/uL (ref 3.87–5.11)
RDW: 13.6 % (ref 11.5–15.5)
WBC: 15.5 10*3/uL — AB (ref 4.0–10.5)

## 2015-02-07 LAB — I-STAT BETA HCG BLOOD, ED (MC, WL, AP ONLY): I-stat hCG, quantitative: <5 m[IU]/mL (ref <5)

## 2015-02-07 LAB — PROTIME-INR
INR: 1.15 (ref 0.00–1.49)
PROTHROMBIN TIME: 14.8 s (ref 11.6–15.2)

## 2015-02-07 LAB — ETHANOL

## 2015-02-07 MED ORDER — HYDROCODONE-ACETAMINOPHEN 5-325 MG PO TABS
1.0000 | ORAL_TABLET | ORAL | Status: DC | PRN
  Administered 2015-02-07 (×2): 2 via ORAL
  Filled 2015-02-07 (×3): qty 2

## 2015-02-07 MED ORDER — HYDROMORPHONE HCL 1 MG/ML IJ SOLN
1.0000 mg | Freq: Once | INTRAMUSCULAR | Status: AC
  Administered 2015-02-07: 1 mg via INTRAVENOUS
  Filled 2015-02-07: qty 1

## 2015-02-07 MED ORDER — TETANUS-DIPHTH-ACELL PERTUSSIS 5-2.5-18.5 LF-MCG/0.5 IM SUSP
0.5000 mL | Freq: Once | INTRAMUSCULAR | Status: AC
  Administered 2015-02-07: 0.5 mL via INTRAMUSCULAR
  Filled 2015-02-07: qty 0.5

## 2015-02-07 MED ORDER — IOHEXOL 300 MG/ML  SOLN
100.0000 mL | Freq: Once | INTRAMUSCULAR | Status: AC | PRN
  Administered 2015-02-07: 100 mL via INTRAVENOUS

## 2015-02-07 MED ORDER — BISACODYL 10 MG RE SUPP: 10.0000 mg | Freq: Every day | RECTAL | Status: DC | PRN

## 2015-02-07 MED ORDER — ONDANSETRON HCL 4 MG/2ML IJ SOLN
4.0000 mg | Freq: Once | INTRAMUSCULAR | Status: AC
  Administered 2015-02-07: 4 mg via INTRAVENOUS
  Filled 2015-02-07: qty 2

## 2015-02-07 MED ORDER — MORPHINE SULFATE 4 MG/ML IJ SOLN
4.0000 mg | Freq: Once | INTRAMUSCULAR | Status: AC
Start: 2015-02-07 — End: 2015-02-07
  Administered 2015-02-07: 4 mg via INTRAVENOUS
  Filled 2015-02-07: qty 1

## 2015-02-07 MED ORDER — ONDANSETRON HCL 4 MG/2ML IJ SOLN
4.0000 mg | Freq: Four times a day (QID) | INTRAMUSCULAR | Status: DC | PRN
Start: 2015-02-07 — End: 2015-02-09
  Administered 2015-02-08 – 2015-02-09 (×4): 4 mg via INTRAVENOUS
  Filled 2015-02-07 (×4): qty 2

## 2015-02-07 MED ORDER — DOCUSATE SODIUM 100 MG PO CAPS
100.0000 mg | ORAL_CAPSULE | Freq: Two times a day (BID) | ORAL | Status: DC
Start: 2015-02-07 — End: 2015-02-09
  Administered 2015-02-07 – 2015-02-09 (×4): 100 mg via ORAL
  Filled 2015-02-07 (×4): qty 1

## 2015-02-07 MED ORDER — POTASSIUM CHLORIDE IN NACL 20-0.9 MEQ/L-% IV SOLN
INTRAVENOUS | Status: DC
  Administered 2015-02-07 – 2015-02-08 (×3): via INTRAVENOUS
  Administered 2015-02-09: 100 mL/h via INTRAVENOUS
  Filled 2015-02-07 (×8): qty 1000

## 2015-02-07 MED ORDER — METOCLOPRAMIDE HCL 5 MG/ML IJ SOLN: 5.0000 mg | Freq: Three times a day (TID) | INTRAMUSCULAR | Status: DC | PRN

## 2015-02-07 MED ORDER — HYDROMORPHONE HCL 1 MG/ML IJ SOLN
0.5010 mg | INTRAMUSCULAR | Status: DC | PRN
  Administered 2015-02-07 – 2015-02-08 (×3): 0.501 mg via INTRAVENOUS
  Filled 2015-02-07 (×3): qty 1
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
## 2015-02-07 NOTE — ED Notes (Signed)
Dr. Thompson at bedside. 
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
## 2015-02-07 NOTE — ED Notes (Signed)
Pt became nauseated while transferring to bedside commode- Dr. Janee Mornhompson at bedside- orders for Zofran received.

## 2015-02-07 NOTE — ED Notes (Signed)
Attempted report 
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
## 2015-02-08 ENCOUNTER — Encounter (HOSPITAL_COMMUNITY): Payer: Self-pay

## 2015-02-08 DIAGNOSIS — S36520A Contusion of ascending [right] colon, initial encounter: Secondary | ICD-10-CM | POA: Diagnosis present

## 2015-02-08 DIAGNOSIS — T07XXXA Unspecified multiple injuries, initial encounter: Secondary | ICD-10-CM

## 2015-02-08 DIAGNOSIS — S36115A Moderate laceration of liver, initial encounter: Secondary | ICD-10-CM | POA: Diagnosis present

## 2015-02-08 LAB — CBC
HCT: 39.7 % (ref 36.0–46.0)
Hemoglobin: 12.8 g/dL (ref 12.0–15.0)
MCH: 29.4 pg (ref 26.0–34.0)
MCHC: 32.2 g/dL (ref 30.0–36.0)
MCV: 91.3 fL (ref 78.0–100.0)
PLATELETS: 158 10*3/uL (ref 150–400)
RBC: 4.35 MIL/uL (ref 3.87–5.11)
RDW: 13.7 % (ref 11.5–15.5)
WBC: 8.5 10*3/uL (ref 4.0–10.5)

## 2015-02-08 LAB — COMPREHENSIVE METABOLIC PANEL
ALT: 40 U/L — AB (ref 0–35)
AST: 38 U/L — AB (ref 0–37)
Albumin: 3.6 g/dL (ref 3.5–5.2)
Alkaline Phosphatase: 49 U/L (ref 39–117)
Anion gap: 7 (ref 5–15)
BILIRUBIN TOTAL: 1.1 mg/dL (ref 0.3–1.2)
BUN: 6 mg/dL (ref 6–23)
CALCIUM: 8.5 mg/dL (ref 8.4–10.5)
CHLORIDE: 108 mmol/L (ref 96–112)
CO2: 22 mmol/L (ref 19–32)
CREATININE: 0.75 mg/dL (ref 0.50–1.10)
GLUCOSE: 84 mg/dL (ref 70–99)
Potassium: 3.7 mmol/L (ref 3.5–5.1)
Sodium: 137 mmol/L (ref 135–145)
Total Protein: 6 g/dL (ref 6.0–8.3)

## 2015-02-08 LAB — CDS SEROLOGY

## 2015-02-08 MED ORDER — OXYCODONE-ACETAMINOPHEN 5-325 MG PO TABS
1.0000 | ORAL_TABLET | ORAL | Status: DC | PRN
  Administered 2015-02-08 (×2): 2 via ORAL
  Filled 2015-02-08 (×2): qty 2

## 2015-02-08 MED ORDER — TRAMADOL HCL 50 MG PO TABS
50.0000 mg | ORAL_TABLET | Freq: Four times a day (QID) | ORAL | Status: DC | PRN
  Administered 2015-02-09: 50 mg via ORAL
  Filled 2015-02-08: qty 1
  Filled 2015-02-08: qty 2

## 2015-02-08 MED ORDER — OXYCODONE HCL 5 MG PO TABS
5.0000 mg | ORAL_TABLET | ORAL | Status: DC | PRN
  Administered 2015-02-09 (×2): 5 mg via ORAL
  Filled 2015-02-08 (×3): qty 1

## 2015-02-08 MED ORDER — KETOROLAC TROMETHAMINE 30 MG/ML IJ SOLN
30.0000 mg | Freq: Once | INTRAMUSCULAR | Status: AC
  Administered 2015-02-08: 30 mg via INTRAVENOUS
  Filled 2015-02-08: qty 1

## 2015-02-08 MED ORDER — METOCLOPRAMIDE HCL 5 MG/ML IJ SOLN
5.0000 mg | Freq: Four times a day (QID) | INTRAMUSCULAR | Status: DC
  Administered 2015-02-08 – 2015-02-09 (×2): 5 mg via INTRAVENOUS
  Filled 2015-02-08 (×2): qty 2

## 2015-02-08 MED ORDER — HYDROMORPHONE HCL 1 MG/ML IJ SOLN
1.0000 mg | INTRAMUSCULAR | Status: DC | PRN
  Administered 2015-02-08: 1 mg via INTRAVENOUS

## 2015-02-08 MED ORDER — METOCLOPRAMIDE HCL 5 MG/5ML PO SOLN: 5.0000 mg | Freq: Four times a day (QID) | ORAL | Status: DC | PRN

## 2015-02-08 MED ORDER — HYDROMORPHONE HCL 1 MG/ML IJ SOLN
0.5000 mg | INTRAMUSCULAR | Status: DC | PRN
  Administered 2015-02-08 – 2015-02-09 (×2): 0.5 mg via INTRAVENOUS
  Filled 2015-02-08 (×2): qty 1

## 2015-02-08 MED ORDER — OXYCODONE-ACETAMINOPHEN 5-325 MG PO TABS
1.0000 | ORAL_TABLET | ORAL | Status: DC | PRN
  Administered 2015-02-09 (×2): 1 via ORAL
  Filled 2015-02-08 (×3): qty 1

## 2015-02-08 MED ORDER — METHOCARBAMOL 750 MG PO TABS
750.0000 mg | ORAL_TABLET | Freq: Three times a day (TID) | ORAL | Status: DC | PRN
  Administered 2015-02-08 (×2): 750 mg via ORAL
  Filled 2015-02-08 (×2): qty 1

## 2015-02-08 MED ORDER — HYDROMORPHONE HCL 1 MG/ML IJ SOLN
1.0000 mg | INTRAMUSCULAR | Status: DC | PRN
  Administered 2015-02-08 (×3): 1 mg via INTRAVENOUS
  Filled 2015-02-08 (×4): qty 1
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
## 2015-02-08 NOTE — Progress Notes (Signed)
OT Cancellation Note  Patient Details Name: Teresa Gamble MRN: [id number] DOB: [birth year]   Cancelled Treatment:    Reason Eval/Treat Not Completed: OT screened, no needs identified, will sign off  Earlie RavelingStraub, Ludmila Ebarb L OTR/L [phone] 02/08/2015, 11:42 AM

## 2015-02-08 NOTE — Progress Notes (Signed)
UR completed 
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
## 2015-02-08 NOTE — Progress Notes (Signed)
Rapid Response RN notified pt has c/o abdominal swelling increase vital signs  Are WNL and in epic 2l O2 placed and 0.5 mg of dilaudid IV given.Ilean SkillVeronica Olena Willy LPN
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
## 2015-02-09 LAB — CBC
HCT: 34.9 % — ABNORMAL LOW (ref 36.0–46.0)
Hemoglobin: 11.4 g/dL — ABNORMAL LOW (ref 12.0–15.0)
MCH: 29.6 pg (ref 26.0–34.0)
MCHC: 32.7 g/dL (ref 30.0–36.0)
MCV: 90.6 fL (ref 78.0–100.0)
Platelets: 145 10*3/uL — ABNORMAL LOW (ref 150–400)
RBC: 3.85 MIL/uL — AB (ref 3.87–5.11)
RDW: 13.4 % (ref 11.5–15.5)
WBC: 7.2 10*3/uL (ref 4.0–10.5)

## 2015-02-09 MED ORDER — METHOCARBAMOL 750 MG PO TABS: 750.0000 mg | ORAL_TABLET | Freq: Three times a day (TID) | ORAL | Status: DC | PRN

## 2015-02-09 MED ORDER — ONDANSETRON 4 MG PO TBDP: 4.0000 mg | ORAL_TABLET | Freq: Three times a day (TID) | ORAL | Status: DC | PRN

## 2015-02-09 MED ORDER — ONDANSETRON 4 MG PO TBDP
4.0000 mg | ORAL_TABLET | Freq: Three times a day (TID) | ORAL | Status: DC | PRN
  Filled 2015-02-09: qty 1

## 2015-02-09 MED ORDER — OXYCODONE HCL 5 MG PO TABS: 5.0000 mg | ORAL_TABLET | Freq: Four times a day (QID) | ORAL | Status: DC | PRN

## 2015-02-09 MED ORDER — DOCUSATE SODIUM 100 MG PO CAPS: 100.0000 mg | ORAL_CAPSULE | Freq: Two times a day (BID) | ORAL | Status: DC | PRN

## 2015-02-09 NOTE — Discharge Instructions (Signed)
Liver Laceration A liver laceration is a tear or cut in the liver. The liver is the largest solid organ in the body and is involved in many important bodily functions. Sometimes, a liver laceration can be a very serious injury. It can cause a lot of bleeding. Surgery may be needed. Other times, a liver laceration may be minor. Bed rest may be all that is needed. Either way, treatment in a hospital is almost always required.  Liver lacerations are categorized in grades from 1 to 5. Low numbers identify lacerations that are less severe than those with high numbers.   Grade 1: This is a tear in the outer lining of the liver. It is less than  inch (1 cm) deep.   Grade 2: This is a tear that is about  inch to 1 inch (1 to 3 cm) deep. It is less than 4 inches (10 cm) long.   Grade 3: This is a tear that is slightly more than 1 inch (3 cm) deep.   Grades 4 and 5: These lacerations are very deep. They affect a large part of the liver.  CAUSES  A strong blow to the area around your liver (blunt trauma). Blunt trauma can tear the liver even though it does not break the skin.  An injury in which an object goes through the skin into the liver (penetrating injury). SIGNS AND SYMPTOMS The most common symptoms of liver laceration are:   A swollen and firm abdomen.   Pain in the abdomen.   Tenderness when pressing on the right side of the abdomen.  Other symptoms may include:   Bleeding from a penetrating wound.   Spitting up blood.   Bruises on the abdomen.   A fast heartbeat.   Taking quick breaths.   Feeling weak and dizzy.  DIAGNOSIS To determine if you have a liver laceration, your health care provider will perform a physical exam and ask about any injuries to the right side of the abdomen. Various tests may be ordered, such as:  Blood tests. Your blood may be tested every few hours. This will show if you are losing blood.   CT scan. This test is used to check for  laceration or bleeding.  Laparoscopy. This involves placing a small camera into the abdomen and looking directly at the surface of the liver. TREATMENT Treatment for a liver laceration will vary depending on how deep the cut is and on the amount of bleeding. Treatment options include:  Bed rest. The person is watched closely. Tests are done very often.   Blood transfusions. Blood is given from someone else. This replaces blood lost from the injury. A transfusion may need to be done several times.   Surgery. A procedure may be needed to open the abdomen. Then, special material might be packed around the laceration to help it heal, or the laceration might be repaired. HOME CARE INSTRUCTIONS  Only take over-the-counter or prescription medicines as directed by your health care provider. Take all prescribed medicines exactly as directed. Do not take any other medicines without first asking your health care provider.  Rest and limit your activity as directed by your health care provider. It may be several months before you can go back to your old routine. Do not participate in activities that involve physical contact or require extra energy until your health care provider approves.  Keep all follow-up appointments with your health care provider. You may need more blood tests or another CT  scan to make sure your injury is healing. SEEK MEDICAL CARE IF:  Your abdominal pain does not go away.   You feel more weak and tired than usual.  SEEK IMMEDIATE MEDICAL CARE IF:  Your abdominal pain gets worse.   You have a cut or incision on your skin that becomes red, swells, or leaks any fluid.   You feel dizzy or very weak.   You have trouble breathing.   You have a fever.  MAKE SURE YOU:  Understand these instructions.  Will watch your condition.  Will get help right away if you are not doing well or get worse. Document Released: 12/14/2010 Document Revised: 07/14/2013 Document  Reviewed: 05/03/2013 [facility] Patient Information 2015 [place], [place]. This information is not intended to replace advice given to you by your health care provider. Make sure you discuss any questions you have with your health care provider.  Contusion A contusion is a deep bruise. Contusions are the result of an injury that caused bleeding under the skin. The contusion may turn blue, purple, or yellow. Minor injuries will give you a painless contusion, but more severe contusions may stay painful and swollen for a few weeks.  CAUSES  A contusion is usually caused by a blow, trauma, or direct force to an area of the body. SYMPTOMS  Swelling and redness of the injured area. Bruising of the injured area. Tenderness and soreness of the injured area. Pain. DIAGNOSIS  The diagnosis can be made by taking a history and physical exam. An X-ray, CT scan, or MRI may be needed to determine if there were any associated injuries, such as fractures. TREATMENT  Specific treatment will depend on what area of the body was injured. In general, the best treatment for a contusion is resting, icing, elevating, and applying cold compresses to the injured area. Over-the-counter medicines may also be recommended for pain control. Ask your caregiver what the best treatment is for your contusion. HOME CARE INSTRUCTIONS  Put ice on the injured area. Put ice in a plastic bag. Place a towel between your skin and the bag. Leave the ice on for 15-20 minutes, 3-4 times a day, or as directed by your health care provider. Only take over-the-counter or prescription medicines for pain, discomfort, or fever as directed by your caregiver. Your caregiver may recommend avoiding anti-inflammatory medicines (aspirin, ibuprofen, and naproxen) for 48 hours because these medicines may increase bruising. Rest the injured area. If possible, elevate the injured area to reduce swelling. SEEK IMMEDIATE MEDICAL CARE IF:  You have increased  bruising or swelling. You have pain that is getting worse. Your swelling or pain is not relieved with medicines. MAKE SURE YOU:  Understand these instructions. Will watch your condition. Will get help right away if you are not doing well or get worse. Document Released: 08/21/2005 Document Revised: 11/16/2013 Document Reviewed: 09/16/2011 [facility] Patient Information 2015 [place], [place]. This information is not intended to replace advice given to you by your health care provider. Make sure you discuss any questions you have with your health care provider.

## 2015-02-09 NOTE — Progress Notes (Addendum)
Verbalizes understanding of all discharge instructions, including home medications and follow up appointments. Discharged at 1345 with all personal belongings, ambulatory to private car home. Adline MangoV Skylen Spiering RN
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
## 2015-02-15 ENCOUNTER — Emergency Department (HOSPITAL_BASED_OUTPATIENT_CLINIC_OR_DEPARTMENT_OTHER)
Admission: EM | Admit: 2015-02-15 | Discharge: 2015-02-15 | Disposition: A | Discharge status: Home / Self Care | Payer: 59 | Attending: Emergency Medicine | Admitting: Emergency Medicine

## 2015-02-15 ENCOUNTER — Emergency Department (HOSPITAL_BASED_OUTPATIENT_CLINIC_OR_DEPARTMENT_OTHER): Payer: 59

## 2015-02-15 ENCOUNTER — Emergency Department (HOSPITAL_COMMUNITY)
Admission: EM | Admit: 2015-02-15 | Discharge: 2015-02-15 | Discharge status: Against Medical Advice | Payer: 59 | Source: Home / Self Care

## 2015-02-15 ENCOUNTER — Encounter (HOSPITAL_COMMUNITY): Payer: Self-pay

## 2015-02-15 ENCOUNTER — Encounter (HOSPITAL_BASED_OUTPATIENT_CLINIC_OR_DEPARTMENT_OTHER): Payer: Self-pay

## 2015-02-15 DIAGNOSIS — M7989 Other specified soft tissue disorders: Secondary | ICD-10-CM | POA: Insufficient documentation

## 2015-02-15 DIAGNOSIS — Z8742 Personal history of other diseases of the female genital tract: Secondary | ICD-10-CM | POA: Diagnosis not present

## 2015-02-15 DIAGNOSIS — Y92410 Unspecified street and highway as the place of occurrence of the external cause: Secondary | ICD-10-CM | POA: Diagnosis not present

## 2015-02-15 DIAGNOSIS — M25572 Pain in left ankle and joints of left foot: Secondary | ICD-10-CM

## 2015-02-15 DIAGNOSIS — S8012XA Contusion of left lower leg, initial encounter: Secondary | ICD-10-CM | POA: Diagnosis not present

## 2015-02-15 DIAGNOSIS — Y9389 Activity, other specified: Secondary | ICD-10-CM | POA: Diagnosis not present

## 2015-02-15 DIAGNOSIS — Y998 Other external cause status: Secondary | ICD-10-CM | POA: Insufficient documentation

## 2015-02-15 DIAGNOSIS — Z87891 Personal history of nicotine dependence: Secondary | ICD-10-CM | POA: Diagnosis not present

## 2015-02-15 DIAGNOSIS — R609 Edema, unspecified: Secondary | ICD-10-CM

## 2015-02-15 DIAGNOSIS — Z87442 Personal history of urinary calculi: Secondary | ICD-10-CM | POA: Diagnosis not present

## 2015-02-15 DIAGNOSIS — Z79899 Other long term (current) drug therapy: Secondary | ICD-10-CM | POA: Insufficient documentation

## 2015-02-15 DIAGNOSIS — Z793 Long term (current) use of hormonal contraceptives: Secondary | ICD-10-CM | POA: Insufficient documentation

## 2015-02-15 DIAGNOSIS — S8992XA Unspecified injury of left lower leg, initial encounter: Secondary | ICD-10-CM | POA: Diagnosis present

## 2015-02-15 HISTORY — DX: Laceration of liver, unspecified degree, initial encounter: S36.113A

## 2015-02-15 MED ORDER — OXYCODONE-ACETAMINOPHEN 5-325 MG PO TABS
ORAL_TABLET | ORAL | Status: AC
  Filled 2015-02-15: qty 2

## 2015-02-15 MED ORDER — OXYCODONE-ACETAMINOPHEN 5-325 MG PO TABS
2.0000 | ORAL_TABLET | Freq: Once | ORAL | Status: AC
  Administered 2015-02-15: 2 via ORAL

## 2015-02-15 MED ORDER — OXYCODONE HCL 5 MG PO TABS: 5.0000 mg | ORAL_TABLET | Freq: Four times a day (QID) | ORAL | Status: DC | PRN

## 2015-02-15 NOTE — ED Notes (Signed)
Family member went to get car to take pt to [facility]
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
## 2015-02-15 NOTE — ED Notes (Addendum)
Pt and visitor states that they may go to [facility] due to wait.  Encouraged pt to stay to be seen.  They state that [facility] is closer to their home but were concerned they may not have crutches there.
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
## 2015-02-15 NOTE — Discharge Instructions (Signed)
Take oxycodone for severe pain only. No driving or operating heavy machinery while taking oxycodone. This medication may cause drowsiness. Return tomorrow for your ultrasound to the radiology department.  Hematoma A hematoma is a collection of blood under the skin, in an organ, in a body space, in a joint space, or in other tissue. The blood can clot to form a lump that you can see and feel. The lump is often firm and may sometimes become sore and tender. Most hematomas get better in a few days to weeks. However, some hematomas may be serious and require medical care. Hematomas can range in size from very small to very large. CAUSES  A hematoma can be caused by a blunt or penetrating injury. It can also be caused by spontaneous leakage from a blood vessel under the skin. Spontaneous leakage from a blood vessel is more likely to occur in older people, especially those taking blood thinners. Sometimes, a hematoma can develop after certain medical procedures. SIGNS AND SYMPTOMS   A firm lump on the body.  Possible pain and tenderness in the area.  Bruising.Blue, dark blue, purple-red, or yellowish skin may appear at the site of the hematoma if the hematoma is close to the surface of the skin. For hematomas in deeper tissues or body spaces, the signs and symptoms may be subtle. For example, an intra-abdominal hematoma may cause abdominal pain, weakness, fainting, and shortness of breath. An intracranial hematoma may cause a headache or symptoms such as weakness, trouble speaking, or a change in consciousness. DIAGNOSIS  A hematoma can usually be diagnosed based on your medical history and a physical exam. Imaging tests may be needed if your health care provider suspects a hematoma in deeper tissues or body spaces, such as the abdomen, head, or chest. These tests may include ultrasonography or a CT scan.  TREATMENT  Hematomas usually go away on their own over time. Rarely does the blood need to be  drained out of the body. Large hematomas or those that may affect vital organs will sometimes need surgical drainage or monitoring. HOME CARE INSTRUCTIONS   Apply ice to the injured area:   Put ice in a plastic bag.   Place a towel between your skin and the bag.   Leave the ice on for 20 minutes, 2-3 times a day for the first 1 to 2 days.   After the first 2 days, switch to using warm compresses on the hematoma.   Elevate the injured area to help decrease pain and swelling. Wrapping the area with an elastic bandage may also be helpful. Compression helps to reduce swelling and promotes shrinking of the hematoma. Make sure the bandage is not wrapped too tight.   If your hematoma is on a lower extremity and is painful, crutches may be helpful for a couple days.   Only take over-the-counter or prescription medicines as directed by your health care provider. SEEK IMMEDIATE MEDICAL CARE IF:   You have increasing pain, or your pain is not controlled with medicine.   You have a fever.   You have worsening swelling or discoloration.   Your skin over the hematoma breaks or starts bleeding.   Your hematoma is in your chest or abdomen and you have weakness, shortness of breath, or a change in consciousness.  Your hematoma is on your scalp (caused by a fall or injury) and you have a worsening headache or a change in alertness or consciousness. MAKE SURE YOU:   Understand these  instructions.  Will watch your condition.  Will get help right away if you are not doing well or get worse. Document Released: 06/25/2004 Document Revised: 07/14/2013 Document Reviewed: 04/21/2013 [facility] Patient Information 2015 [place], [place]. This information is not intended to replace advice given to you by your health care provider. Make sure you discuss any questions you have with your health care provider. Deep Vein Thrombosis A deep vein thrombosis (DVT) is a blood clot that develops in the  deep, larger veins of the leg, arm, or pelvis. These are more dangerous than clots that might form in veins near the surface of the body. A DVT can lead to serious and even life-threatening complications if the clot breaks off and travels in the bloodstream to the lungs.  A DVT can damage the valves in your leg veins so that instead of flowing upward, the blood pools in the lower leg. This is called post-thrombotic syndrome, and it can result in pain, swelling, discoloration, and sores on the leg. CAUSES Usually, several things contribute to the formation of blood clots. Contributing factors include:  The flow of blood slows down.  The inside of the vein is damaged in some way.  You have a condition that makes blood clot more easily. RISK FACTORS Some people are more likely than others to develop blood clots. Risk factors include:   Smoking.  Being overweight (obese).  Sitting or lying still for a long time. This includes long-distance travel, paralysis, or recovery from an illness or surgery. Other factors that increase risk are:   Older age, especially over 27 years of age.  Having a family history of blood clots or if you have already had a blot clot.  Having major or lengthy surgery. This is especially true for surgery on the hip, knee, or belly (abdomen). Hip surgery is particularly high risk.  Having a long, thin tube (catheter) placed inside a vein during a medical procedure.  Breaking a hip or leg.  Having cancer or cancer treatment.  Pregnancy and childbirth.  Hormone changes make the blood clot more easily during pregnancy.  The fetus puts pressure on the veins of the pelvis.  There is a risk of injury to veins during delivery or a caesarean delivery. The risk is highest just after childbirth.  Medicines containing the female hormone estrogen. This includes birth control pills and hormone replacement therapy.  Other circulation or heart problems.  SIGNS AND  SYMPTOMS When a clot forms, it can either partially or totally block the blood flow in that vein. Symptoms of a DVT can include:  Swelling of the leg or arm, especially if one side is much worse.  Warmth and redness of the leg or arm, especially if one side is much worse.  Pain in an arm or leg. If the clot is in the leg, symptoms may be more noticeable or worse when standing or walking. The symptoms of a DVT that has traveled to the lungs (pulmonary embolism, PE) usually start suddenly and include:  Shortness of breath.  Coughing.  Coughing up blood or blood-tinged mucus.  Chest pain. The chest pain is often worse with deep breaths.  Rapid heartbeat. Anyone with these symptoms should get emergency medical treatment right away. Do not wait to see if the symptoms will go away. Call your local emergency services (911 in the [place] if you have these symptoms. Do not drive yourself to the hospital. DIAGNOSIS If a DVT is suspected, your health care provider will take  a full medical history and perform a physical exam. Tests that also may be required include:  Blood tests, including studies of the clotting properties of the blood.  Ultrasound to see if you have clots in your legs or lungs.  X-rays to show the flow of blood when dye is injected into the veins (venogram).  Studies of your lungs if you have any chest symptoms. PREVENTION  Exercise the legs regularly. Take a brisk 30-minute walk every day.  Maintain a weight that is appropriate for your height.  Avoid sitting or lying in bed for long periods of time without moving your legs.  Women, particularly those over the age of 35 years, should consider the risks and benefits of taking estrogen medicines, including birth control pills.  Do not smoke, especially if you take estrogen medicines.  Long-distance travel can increase your risk of DVT. You should exercise your legs by walking or pumping the muscles every hour.  Many of  the risk factors above relate to situations that exist with hospitalization, either for illness, injury, or elective surgery. Prevention may include medical and nonmedical measures.  Your health care provider will assess you for the need for venous thromboembolism prevention when you are admitted to the hospital. If you are having surgery, your surgeon will assess you the day of or day after surgery. TREATMENT Once identified, a DVT can be treated. It can also be prevented in some circumstances. Once you have had a DVT, you may be at increased risk for a DVT in the future. The most common treatment for DVT is blood-thinning (anticoagulant) medicine, which reduces the blood's tendency to clot. Anticoagulants can stop new blood clots from forming and stop old clots from growing. They cannot dissolve existing clots. Your body does this by itself over time. Anticoagulants can be given by mouth, through an IV tube, or by injection. Your health care provider will determine the best program for you. Other medicines or treatments that may be used are:  Heparin or related medicines (low molecular weight heparin) are often the first treatment for a blood clot. They act quickly. However, they cannot be taken orally and must be given either in shot form or by IV tube.  Heparin can cause a fall in a component of blood that stops bleeding and forms blood clots (platelets). You will be monitored with blood tests to be sure this does not occur.  Warfarin is an anticoagulant that can be swallowed. It takes a few days to start working, so usually heparin or related medicines are used in combination. Once warfarin is working, heparin is usually stopped.  Factor Xa inhibitor medicines, such as rivaroxaban and apixaban, also reduce blood clotting. These medicines are taken orally and can often be used without heparin or related medicines.  Less commonly, clot dissolving drugs (thrombolytics) are used to dissolve a DVT.  They carry a high risk of bleeding, so they are used mainly in severe cases where your life or a part of your body is threatened.  Very rarely, a blood clot in the leg needs to be removed surgically.  If you are unable to take anticoagulants, your health care provider may arrange for you to have a filter placed in a main vein in your abdomen. This filter prevents clots from traveling to your lungs. HOME CARE INSTRUCTIONS  Take all medicines as directed by your health care provider.  Learn as much as you can about DVT.  Wear a medical alert bracelet or carry  a medical alert card.  Ask your health care provider how soon you can go back to normal activities. It is important to stay active to prevent blood clots. If you are on anticoagulant medicine, avoid contact sports.  It is very important to exercise. This is especially important while traveling, sitting, or standing for long periods of time. Exercise your legs by walking or by tightening and relaxing your leg muscles regularly. Take frequent walks.  You may need to wear compression stockings. These are tight elastic stockings that apply pressure to the lower legs. This pressure can help keep the blood in the legs from clotting. Taking Warfarin Warfarin is a daily medicine that is taken by mouth. Your health care provider will advise you on the length of treatment (usually 3-6 months, sometimes lifelong). If you take warfarin:  Understand how to take warfarin and foods that can affect how warfarin works in [employer] account [employer].  Too much and too little warfarin are both dangerous. Too much warfarin increases the risk of bleeding. Too little warfarin continues to allow the risk for blood clots. Warfarin and Regular Blood Testing While taking warfarin, you will need to have regular blood tests to measure your blood clotting time. These blood tests usually include both the prothrombin time (PT) and international normalized ratio (INR) tests. The PT and  INR results allow your health care provider to adjust your dose of warfarin. It is very important that you have your PT and INR tested as often as directed by your health care provider.  Warfarin and Your Diet Avoid major changes in your diet, or notify your health care provider before changing your diet. Arrange a visit with a registered dietitian to answer your questions. Many foods, especially foods high in vitamin K, can interfere with warfarin and affect the PT and INR results. You should eat a consistent amount of foods high in vitamin K. Foods high in vitamin K include:   Spinach, kale, broccoli, cabbage, collard and turnip greens, Brussels sprouts, peas, cauliflower, seaweed, and parsley.  Beef and pork liver.  Green tea.  Soybean oil. Warfarin with Other Medicines Many medicines can interfere with warfarin and affect the PT and INR results. You must:  Tell your health care provider about any and all medicines, vitamins, and supplements you take, including aspirin and other over-the-counter anti-inflammatory medicines. Be especially cautious with aspirin and anti-inflammatory medicines. Ask your health care provider before taking these.  Do not take or discontinue any prescribed or over-the-counter medicine except on the advice of your health care provider or pharmacist. Warfarin Side Effects Warfarin can have side effects, such as easy bruising and difficulty stopping bleeding. Ask your health care provider or pharmacist about other side effects of warfarin. You will need to:  Hold pressure over cuts for longer than usual.  Notify your dentist and other health care providers that you are taking warfarin before you undergo any procedures where bleeding may occur. Warfarin with Alcohol and Tobacco   Drinking alcohol frequently can increase the effect of warfarin, leading to excess bleeding. It is best to avoid alcoholic drinks or to consume only very small amounts while taking  warfarin. Notify your health care provider if you change your alcohol intake.   Do not use any tobacco products including cigarettes, chewing tobacco, or electronic cigarettes. If you smoke, quit. Ask your health care provider for help with quitting smoking. Alternative Medicines to Warfarin: Factor Xa Inhibitor Medicines  These blood-thinning medicines are taken by mouth, usually  for several weeks or longer. It is important to take the medicine every single day at the same time each day.  There are no regular blood tests required when using these medicines.  There are fewer food and drug interactions than with warfarin.  The side effects of this class of medicine are similar to those of warfarin, including excessive bruising or bleeding. Ask your health care provider or pharmacist about other potential side effects. SEEK MEDICAL CARE IF:  You notice a rapid heartbeat.  You feel weaker or more tired than usual.  You feel faint.  You notice increased bruising.  You feel your symptoms are not getting better in the time expected.  You believe you are having side effects of medicine. SEEK IMMEDIATE MEDICAL CARE IF:  You have chest pain.  You have trouble breathing.  You have new or increased swelling or pain in one leg.  You cough up blood.  You notice blood in vomit, in a bowel movement, or in urine. MAKE SURE YOU:  Understand these instructions.  Will watch your condition.  Will get help right away if you are not doing well or get worse. Document Released: 11/11/2005 Document Revised: 03/28/2014 Document Reviewed: 07/19/2013 [facility] Patient Information 2015 [place], [place]. This information is not intended to replace advice given to you by your health care provider. Make sure you discuss any questions you have with your health care provider.

## 2015-02-16 ENCOUNTER — Other Ambulatory Visit (HOSPITAL_BASED_OUTPATIENT_CLINIC_OR_DEPARTMENT_OTHER): Payer: Self-pay | Admitting: Pediatrics

## 2015-02-16 ENCOUNTER — Ambulatory Visit (HOSPITAL_BASED_OUTPATIENT_CLINIC_OR_DEPARTMENT_OTHER)
Admission: RE | Admit: 2015-02-16 | Discharge: 2015-02-16 | Disposition: A | Discharge status: Home / Self Care | Payer: 59 | Source: Ambulatory Visit | Attending: Pediatrics | Admitting: Pediatrics

## 2015-02-16 ENCOUNTER — Inpatient Hospital Stay (HOSPITAL_BASED_OUTPATIENT_CLINIC_OR_DEPARTMENT_OTHER): Admit: 2015-02-16 | Payer: 59

## 2015-02-16 DIAGNOSIS — M79605 Pain in left leg: Secondary | ICD-10-CM | POA: Insufficient documentation

## 2015-02-16 DIAGNOSIS — S8012XA Contusion of left lower leg, initial encounter: Secondary | ICD-10-CM | POA: Diagnosis not present

## 2015-02-16 DIAGNOSIS — M79604 Pain in right leg: Secondary | ICD-10-CM | POA: Diagnosis not present

## 2015-02-16 DIAGNOSIS — R52 Pain, unspecified: Secondary | ICD-10-CM

## 2015-02-16 DIAGNOSIS — S8011XA Contusion of right lower leg, initial encounter: Secondary | ICD-10-CM | POA: Insufficient documentation

## 2015-02-19 ENCOUNTER — Emergency Department (HOSPITAL_BASED_OUTPATIENT_CLINIC_OR_DEPARTMENT_OTHER): Payer: 59

## 2015-02-19 ENCOUNTER — Emergency Department (HOSPITAL_BASED_OUTPATIENT_CLINIC_OR_DEPARTMENT_OTHER)
Admission: EM | Admit: 2015-02-19 | Discharge: 2015-02-19 | Disposition: A | Discharge status: Home / Self Care | Payer: 59 | Attending: Emergency Medicine | Admitting: Emergency Medicine

## 2015-02-19 ENCOUNTER — Encounter (HOSPITAL_BASED_OUTPATIENT_CLINIC_OR_DEPARTMENT_OTHER): Payer: Self-pay | Admitting: *Deleted

## 2015-02-19 DIAGNOSIS — Z87828 Personal history of other (healed) physical injury and trauma: Secondary | ICD-10-CM | POA: Insufficient documentation

## 2015-02-19 DIAGNOSIS — Z87442 Personal history of urinary calculi: Secondary | ICD-10-CM | POA: Diagnosis not present

## 2015-02-19 DIAGNOSIS — Z79899 Other long term (current) drug therapy: Secondary | ICD-10-CM | POA: Insufficient documentation

## 2015-02-19 DIAGNOSIS — Z87891 Personal history of nicotine dependence: Secondary | ICD-10-CM | POA: Diagnosis not present

## 2015-02-19 DIAGNOSIS — M25572 Pain in left ankle and joints of left foot: Secondary | ICD-10-CM | POA: Diagnosis present

## 2015-02-19 DIAGNOSIS — M25472 Effusion, left ankle: Secondary | ICD-10-CM | POA: Insufficient documentation

## 2015-02-19 MED ORDER — OXYCODONE-ACETAMINOPHEN 5-325 MG PO TABS: 1.0000 | ORAL_TABLET | Freq: Four times a day (QID) | ORAL | Status: DC | PRN
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
## 2015-02-20 ENCOUNTER — Encounter: Payer: Self-pay | Admitting: Family Medicine

## 2015-02-20 ENCOUNTER — Ambulatory Visit (INDEPENDENT_AMBULATORY_CARE_PROVIDER_SITE_OTHER): Payer: 59 | Admitting: Family Medicine

## 2015-02-20 VITALS — BP 104/72 | HR 78 | Ht 63.0 in | Wt 150.0 lb

## 2015-02-20 DIAGNOSIS — S93402A Sprain of unspecified ligament of left ankle, initial encounter: Secondary | ICD-10-CM

## 2015-02-20 DIAGNOSIS — S8992XA Unspecified injury of left lower leg, initial encounter: Secondary | ICD-10-CM | POA: Diagnosis not present

## 2015-02-20 MED ORDER — OXYCODONE-ACETAMINOPHEN 7.5-325 MG PO TABS: 1.0000 | ORAL_TABLET | Freq: Four times a day (QID) | ORAL | Status: DC | PRN

## 2015-02-20 NOTE — Patient Instructions (Signed)
You have an ankle sprain, knee contusion and hematoma. Worst case scenario you also have a medial meniscus tear of your knee but these are treated conservatively initially. Ice the ankle for 15 minutes at a time, 3-4 times a day - ice or heat for the knee, whichever feels better. Aleve 2 tabs twice a day with food OR ibuprofen 3 tabs three times a day with food for pain and inflammation. Elevate above the level of your heart when possible Crutches if needed to help with walking Bear weight when tolerated Use laceup ankle brace to help with stability while you recover from this injury (typically for a total of 6 weeks). Come out of the boot/brace twice a day to do Up/down and alphabet exercises 2-3 sets of each. Straight leg raises 3 sets of 10 once a day;  Motion exercises of knee as well. Consider physical therapy for strengthening and balance exercises in the future. Out of work. Follow up with me in 2 weeks for reevaluation.

## 2015-02-23 DIAGNOSIS — S93402A Sprain of unspecified ligament of left ankle, initial encounter: Secondary | ICD-10-CM | POA: Insufficient documentation

## 2015-02-23 DIAGNOSIS — S8992XA Unspecified injury of left lower leg, initial encounter: Secondary | ICD-10-CM | POA: Insufficient documentation

## 2015-02-23 NOTE — Assessment & Plan Note (Signed)
ASO, nsaids, motion exercises reviewed.  Icing, percocet as needed.  Out of work.  F/u in 2 weeks.  Crutches if needed.

## 2015-02-23 NOTE — Assessment & Plan Note (Signed)
Left knee contusion and hematoma - reassured.  Worst case scenario is she has a medial meniscus tear in addition to these but would still recommend conservative measures.  Icing, home exercises, nsaids, percocet as needed.  F/u in 2 weeks.  Out of work in meantime.
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
## 2015-02-27 ENCOUNTER — Telehealth (HOSPITAL_COMMUNITY): Payer: Self-pay | Admitting: Emergency Medicine

## 2015-02-27 NOTE — Telephone Encounter (Signed)
Still having severe pain, would like f/u appt. Scheduled appt for this Wednesday.

## 2015-03-06 ENCOUNTER — Ambulatory Visit: Payer: 59 | Admitting: Family Medicine

## 2015-03-09 ENCOUNTER — Encounter: Payer: Self-pay | Admitting: Family Medicine

## 2015-03-09 ENCOUNTER — Ambulatory Visit (INDEPENDENT_AMBULATORY_CARE_PROVIDER_SITE_OTHER): Payer: 59 | Admitting: Family Medicine

## 2015-03-09 VITALS — BP 105/71 | HR 84 | Ht 63.0 in | Wt 160.0 lb

## 2015-03-09 DIAGNOSIS — S8992XD Unspecified injury of left lower leg, subsequent encounter: Secondary | ICD-10-CM

## 2015-03-09 DIAGNOSIS — S93402A Sprain of unspecified ligament of left ankle, initial encounter: Secondary | ICD-10-CM

## 2015-03-09 DIAGNOSIS — M25561 Pain in right knee: Secondary | ICD-10-CM | POA: Insufficient documentation

## 2015-03-09 DIAGNOSIS — S93402D Sprain of unspecified ligament of left ankle, subsequent encounter: Secondary | ICD-10-CM

## 2015-03-09 NOTE — Assessment & Plan Note (Signed)
Left knee contusion and hematoma - reassured.  Worst case scenario is she has a medial meniscus tear in addition to these but would still recommend conservative measures out to 6 weeks.  Add physical therapy.  Icing, home exercises, nsaids, percocet as needed.  F/u in 4 weeks.

## 2015-03-09 NOTE — Patient Instructions (Signed)
You have an ankle sprain, knee contusion and hematoma, right pes anserine bursitis. Worst case scenario you also have a medial meniscus tear of your knee but these are treated conservatively initially for at least 6 weeks. Ice or heat 15 minutes at a time 3-4 times a day (whichever feels better). Aleve 2 tabs twice a day with food OR ibuprofen 3 tabs three times a day with food for pain and inflammation. Elevate as needed for swelling. Use laceup ankle brace to help with stability while you recover from the ankle sprain (typically for a total of 6 weeks). Come out of the boot/brace twice a day to do Up/down and alphabet exercises 2-3 sets of each. Start physical therapy for both knees and left ankle. Follow up with me in 4 weeks for reevaluation.

## 2015-03-09 NOTE — Assessment & Plan Note (Signed)
compensatory, consistent with pes tendinopathy and bursitis.  Physical therapy, icing, nsaids.

## 2015-03-09 NOTE — Assessment & Plan Note (Signed)
ASO, nsaids, motion exercises reviewed.  Add physical therapy.  Icing, percocet as needed.  Returning to work next week.  F/u in 4 weeks for reevaluation.
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
## 2015-03-13 ENCOUNTER — Telehealth: Payer: Self-pay | Admitting: *Deleted

## 2015-03-13 NOTE — Telephone Encounter (Signed)
Unable to reach pt at this time will try again later.  

## 2015-03-13 NOTE — Telephone Encounter (Signed)
Pre visit completed 

## 2015-03-14 ENCOUNTER — Encounter: Payer: Self-pay | Admitting: Physician Assistant

## 2015-03-14 ENCOUNTER — Ambulatory Visit (INDEPENDENT_AMBULATORY_CARE_PROVIDER_SITE_OTHER): Payer: 59 | Admitting: Physician Assistant

## 2015-03-14 DIAGNOSIS — S36114D Minor laceration of liver, subsequent encounter: Secondary | ICD-10-CM

## 2015-03-14 DIAGNOSIS — R5383 Other fatigue: Secondary | ICD-10-CM

## 2015-03-14 DIAGNOSIS — M2560 Stiffness of unspecified joint, not elsewhere classified: Secondary | ICD-10-CM

## 2015-03-14 DIAGNOSIS — S36115D Moderate laceration of liver, subsequent encounter: Secondary | ICD-10-CM

## 2015-03-14 MED ORDER — METHOCARBAMOL 750 MG PO TABS: 750.0000 mg | ORAL_TABLET | Freq: Every day | ORAL | Status: DC

## 2015-03-14 MED ORDER — OXYCODONE-ACETAMINOPHEN 7.5-325 MG PO TABS: 1.0000 | ORAL_TABLET | Freq: Four times a day (QID) | ORAL | Status: DC | PRN

## 2015-03-14 NOTE — Progress Notes (Signed)
Pre visit review using our clinic review tool, if applicable. No additional management support is needed unless otherwise documented below in the visit note/SLS  

## 2015-03-14 NOTE — Assessment & Plan Note (Signed)
Patient with mild right upper quadrant tenderness. No other evidence of liver trauma or dysfunction. We'll refer to general surgery, per patient request. Supportive measures discussed. She is to remain out of work until cleared by surgery. Encourage patient to lift no more than 10 pounds.

## 2015-03-14 NOTE — Assessment & Plan Note (Signed)
Doing well overall considering. Reassurance given regarding her fatigue. This will improve with time. Urged her to stay moving around the house. Avoid heavy lifting or over exertion. Continue pain medicines as needed. Will restart muscle relaxant at bedtime. Follow-up sports medicine as directed. Encouraged patient to obtain FMLA paperwork for work it will help excuse her absences for appointments. Encouraged her to start physical therapy as scheduled. Follow-up in one month.
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
## 2015-03-14 NOTE — Patient Instructions (Signed)
Please continue Percocet as before. Take the Robaxin at night. Stay active around the house -- getting up and moving throughout the day. No lifting more than 10 pounds until cleared by surgery. A referral has been placed for follow-up. Follow-up with [facility] as scheduled. Physical Therapy will be very beneficial.  Follow-up with me in 1 month.  Don't forget to have work send me FMLA paperwork. The fax is [phone]

## 2015-03-23 ENCOUNTER — Telehealth: Payer: Self-pay | Admitting: Physician Assistant

## 2015-03-23 NOTE — Telephone Encounter (Signed)
Caller name: Glassburn,Shakisha  Relation to pt: self  Call back number: [phone]   Reason for call:  Pt states at last visit 03/14/2015 PA referred a liver specialist and would like to know the status. Please advise

## 2015-03-24 ENCOUNTER — Telehealth (HOSPITAL_COMMUNITY): Payer: Self-pay

## 2015-03-24 NOTE — Telephone Encounter (Signed)
LM for Teresa Gamble at [facility] to get pt appt for next week.

## 2015-03-24 NOTE — Telephone Encounter (Signed)
Contacted [facility] to check on status of referral, they state referral can not be done due to patient not having a PCP listed on her insurance card, so referral can be made. Patient was made aware of this per Darl PikesSusan @ [facility]. I called pt to tell her she need to have our office listed as PCP, no voicemail, will call back

## 2015-03-24 NOTE — Telephone Encounter (Signed)
Will you check on status for the general surgery referral and contact the patient?  Seems referral went through and she is just awaiting appointment.

## 2015-03-27 ENCOUNTER — Ambulatory Visit: Payer: No Typology Code available for payment source | Admitting: Physical Therapy

## 2015-03-28 NOTE — Telephone Encounter (Signed)
Called patient again, still no voicemail or answer

## 2015-03-30 ENCOUNTER — Ambulatory Visit: Payer: 59 | Attending: Family Medicine | Admitting: Physical Therapy

## 2015-03-30 DIAGNOSIS — M25561 Pain in right knee: Secondary | ICD-10-CM | POA: Diagnosis not present

## 2015-03-30 DIAGNOSIS — M25572 Pain in left ankle and joints of left foot: Secondary | ICD-10-CM | POA: Insufficient documentation

## 2015-03-30 DIAGNOSIS — M25562 Pain in left knee: Secondary | ICD-10-CM | POA: Diagnosis not present
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
## 2015-03-30 NOTE — Patient Instructions (Addendum)
  Hamstring Stretch, Reclined (Strap, Doorframe)   Lengthen bottom leg on floor. Extend top leg along edge of doorframe or press foot up into yoga strap. Hold for 30____ breaths. Repeat 3____ times each leg.  Piriformis (Supine)  Cross legs, right on top. Gently pull other knee toward chest until stretch is felt in buttock/hip of top leg. Hold ____ seconds. Repeat ____ times per set. Do ____ sets per session. Do ____ sessions per day.   Quadriceps (Prone)   On stomach with sheet around ankles, knees together, hips down, pull heels toward bottom. Keep hips flat. Hold ____ seconds. Repeat ____ times. Do ____ sessions per day. CAUTION: Stretch should be gentle, steady and slow.  Copyright  VHI. All rights reserved.   Achilles Tendon Stretch   Stand with hands supported on wall, elbows slightly bent, feet parallel and both heels on floor, front knee bent, back knee straight. Slowly relax back knee until a stretch is felt in achilles tendon. Hold __30_ seconds. Repeat with leg positions switched. Repeat with back knee slightly bent.  Copyright  VHI. All rights reserved.   Teresa PalmJulie Elleni Gamble, PT 03/30/2015 10:06 AM [place] [phone]

## 2015-04-06 ENCOUNTER — Ambulatory Visit: Payer: 59

## 2015-04-06 ENCOUNTER — Ambulatory Visit: Payer: 59 | Admitting: Family Medicine

## 2015-04-11 ENCOUNTER — Ambulatory Visit (INDEPENDENT_AMBULATORY_CARE_PROVIDER_SITE_OTHER): Payer: 59 | Admitting: Family Medicine

## 2015-04-11 ENCOUNTER — Encounter: Payer: Self-pay | Admitting: Family Medicine

## 2015-04-11 VITALS — BP 104/73 | HR 78 | Ht 63.0 in | Wt 150.0 lb

## 2015-04-11 DIAGNOSIS — S8992XD Unspecified injury of left lower leg, subsequent encounter: Secondary | ICD-10-CM | POA: Diagnosis not present

## 2015-04-11 DIAGNOSIS — S93402D Sprain of unspecified ligament of left ankle, subsequent encounter: Secondary | ICD-10-CM

## 2015-04-12 NOTE — Assessment & Plan Note (Signed)
Left knee contusion and hematoma - reassured.  Much improved, no issues currently.  F/u prn.
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
## 2015-04-12 NOTE — Assessment & Plan Note (Signed)
Doing well with ASO, home exercise program, physical therapy.  Continue PT and HEP. ASO for stability.  F/u prn.

## 2015-04-13 ENCOUNTER — Encounter: Payer: Self-pay | Admitting: Rehabilitation

## 2015-04-13 ENCOUNTER — Ambulatory Visit: Payer: 59 | Admitting: Rehabilitation

## 2015-04-13 DIAGNOSIS — M25561 Pain in right knee: Secondary | ICD-10-CM | POA: Diagnosis not present

## 2015-04-13 DIAGNOSIS — M25562 Pain in left knee: Principal | ICD-10-CM

## 2015-04-13 DIAGNOSIS — M25572 Pain in left ankle and joints of left foot: Secondary | ICD-10-CM

## 2015-04-13 NOTE — Patient Instructions (Signed)
Pt to continue yoga and HEP and to slowly work back into jogging as tolerated
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
## 2015-04-14 ENCOUNTER — Ambulatory Visit: Payer: 59 | Admitting: Physician Assistant

## 2015-04-14 DIAGNOSIS — Z0289 Encounter for other administrative examinations: Secondary | ICD-10-CM

## 2015-04-27 ENCOUNTER — Encounter: Payer: Self-pay | Admitting: Physician Assistant

## 2015-04-27 ENCOUNTER — Telehealth: Payer: Self-pay | Admitting: Physician Assistant

## 2015-04-27 NOTE — Telephone Encounter (Signed)
Pt was no show for appt on 5/20- letter sent. Charge?

## 2015-04-27 NOTE — Telephone Encounter (Signed)
Charge. 

## 2015-05-15 ENCOUNTER — Ambulatory Visit: Payer: 59 | Admitting: Physician Assistant

## 2015-05-16 ENCOUNTER — Ambulatory Visit (INDEPENDENT_AMBULATORY_CARE_PROVIDER_SITE_OTHER): Payer: 59 | Admitting: Physician Assistant

## 2015-05-16 ENCOUNTER — Encounter: Payer: Self-pay | Admitting: Physician Assistant

## 2015-05-16 VITALS — BP 104/62 | HR 85 | Temp 98.4°F | Resp 16 | Ht 63.0 in | Wt 148.4 lb

## 2015-05-16 DIAGNOSIS — M940 Chondrocostal junction syndrome [Tietze]: Secondary | ICD-10-CM

## 2015-05-16 MED ORDER — MELOXICAM 15 MG PO TABS: 15.0000 mg | ORAL_TABLET | Freq: Every day | ORAL | Status: DC

## 2015-05-16 NOTE — Patient Instructions (Signed)
Please take the Mobic daily as directed with food.  Increase fluids and avoid heavy lifting.  You can use tylenol for breakthrough pain.  Call or return to clinic if symptoms are not improving.
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
## 2015-05-16 NOTE — Assessment & Plan Note (Signed)
Mild. Likely due to recent strenuous activity.  Rx Mobic.  Supportive measures reviewed.  Follow-up if symptoms are not improving.

## 2015-05-16 NOTE — Progress Notes (Signed)
Pre visit review using our clinic review tool, if applicable. No additional management support is needed unless otherwise documented below in the visit note/SLS  

## 2015-06-03 ENCOUNTER — Emergency Department (HOSPITAL_BASED_OUTPATIENT_CLINIC_OR_DEPARTMENT_OTHER)
Admission: EM | Admit: 2015-06-03 | Discharge: 2015-06-03 | Disposition: A | Discharge status: Home / Self Care | Payer: 59 | Attending: Emergency Medicine | Admitting: Emergency Medicine

## 2015-06-03 ENCOUNTER — Emergency Department (HOSPITAL_BASED_OUTPATIENT_CLINIC_OR_DEPARTMENT_OTHER): Payer: 59

## 2015-06-03 ENCOUNTER — Encounter (HOSPITAL_BASED_OUTPATIENT_CLINIC_OR_DEPARTMENT_OTHER): Payer: Self-pay | Admitting: *Deleted

## 2015-06-03 DIAGNOSIS — Z79899 Other long term (current) drug therapy: Secondary | ICD-10-CM | POA: Diagnosis not present

## 2015-06-03 DIAGNOSIS — Z8742 Personal history of other diseases of the female genital tract: Secondary | ICD-10-CM | POA: Diagnosis not present

## 2015-06-03 DIAGNOSIS — G8929 Other chronic pain: Secondary | ICD-10-CM | POA: Insufficient documentation

## 2015-06-03 DIAGNOSIS — Y990 Civilian activity done for income or pay: Secondary | ICD-10-CM | POA: Insufficient documentation

## 2015-06-03 DIAGNOSIS — Y9289 Other specified places as the place of occurrence of the external cause: Secondary | ICD-10-CM | POA: Diagnosis not present

## 2015-06-03 DIAGNOSIS — S29011A Strain of muscle and tendon of front wall of thorax, initial encounter: Secondary | ICD-10-CM | POA: Insufficient documentation

## 2015-06-03 DIAGNOSIS — Y9389 Activity, other specified: Secondary | ICD-10-CM | POA: Diagnosis not present

## 2015-06-03 DIAGNOSIS — Z87891 Personal history of nicotine dependence: Secondary | ICD-10-CM | POA: Diagnosis not present

## 2015-06-03 DIAGNOSIS — X58XXXA Exposure to other specified factors, initial encounter: Secondary | ICD-10-CM | POA: Insufficient documentation

## 2015-06-03 DIAGNOSIS — Z791 Long term (current) use of non-steroidal anti-inflammatories (NSAID): Secondary | ICD-10-CM | POA: Insufficient documentation

## 2015-06-03 DIAGNOSIS — Z87442 Personal history of urinary calculi: Secondary | ICD-10-CM | POA: Insufficient documentation

## 2015-06-03 DIAGNOSIS — S299XXA Unspecified injury of thorax, initial encounter: Secondary | ICD-10-CM | POA: Diagnosis present

## 2015-06-03 MED ORDER — KETOROLAC TROMETHAMINE 60 MG/2ML IM SOLN
60.0000 mg | Freq: Once | INTRAMUSCULAR | Status: AC
  Administered 2015-06-03: 60 mg via INTRAMUSCULAR
  Filled 2015-06-03: qty 2
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
## 2015-06-03 NOTE — ED Notes (Signed)
MD at bedside. 

## 2015-06-03 NOTE — Discharge Instructions (Signed)
Ibuprofen 600 mg every 6 hours as needed for pain.  Rest.  Follow-up with your primary Dr. if not improving in the next week.   Muscle Strain A muscle strain is an injury that occurs when a muscle is stretched beyond its normal length. Usually a small number of muscle fibers are torn when this happens. Muscle strain is rated in degrees. First-degree strains have the least amount of muscle fiber tearing and pain. Second-degree and third-degree strains have increasingly more tearing and pain.  Usually, recovery from muscle strain takes 1-2 weeks. Complete healing takes 5-6 weeks.  CAUSES  Muscle strain happens when a sudden, violent force placed on a muscle stretches it too far. This may occur with lifting, sports, or a fall.  RISK FACTORS Muscle strain is especially common in athletes.  SIGNS AND SYMPTOMS At the site of the muscle strain, there may be:  Pain.  Bruising.  Swelling.  Difficulty using the muscle due to pain or lack of normal function. DIAGNOSIS  Your health care provider will perform a physical exam and ask about your medical history. TREATMENT  Often, the best treatment for a muscle strain is resting, icing, and applying cold compresses to the injured area.  HOME CARE INSTRUCTIONS   Use the PRICE method of treatment to promote muscle healing during the first 2-3 days after your injury. The PRICE method involves:  Protecting the muscle from being injured again.  Restricting your activity and resting the injured body part.  Icing your injury. To do this, put ice in a plastic bag. Place a towel between your skin and the bag. Then, apply the ice and leave it on from 15-20 minutes each hour. After the third day, switch to moist heat packs.  Apply compression to the injured area with a splint or elastic bandage. Be careful not to wrap it too tightly. This may interfere with blood circulation or increase swelling.  Elevate the injured body part above the level of your  heart as often as you can.  Only take over-the-counter or prescription medicines for pain, discomfort, or fever as directed by your health care provider.  Warming up prior to exercise helps to prevent future muscle strains. SEEK MEDICAL CARE IF:   You have increasing pain or swelling in the injured area.  You have numbness, tingling, or a significant loss of strength in the injured area. MAKE SURE YOU:   Understand these instructions.  Will watch your condition.  Will get help right away if you are not doing well or get worse. Document Released: 11/11/2005 Document Revised: 09/01/2013 Document Reviewed: 06/10/2013 [facility] Patient Information 2015 [employer], [place]. This information is not intended to replace advice given to you by your health care provider. Make sure you discuss any questions you have with your health care provider.
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
## 2015-07-12 ENCOUNTER — Telehealth: Payer: Self-pay | Admitting: Physician Assistant

## 2015-07-12 ENCOUNTER — Ambulatory Visit (INDEPENDENT_AMBULATORY_CARE_PROVIDER_SITE_OTHER): Payer: 59 | Admitting: Family Medicine

## 2015-07-12 ENCOUNTER — Encounter: Payer: Self-pay | Admitting: Family Medicine

## 2015-07-12 VITALS — BP 114/79 | HR 65 | Ht 62.0 in | Wt 150.0 lb

## 2015-07-12 DIAGNOSIS — M25562 Pain in left knee: Secondary | ICD-10-CM | POA: Diagnosis not present

## 2015-07-12 NOTE — Telephone Encounter (Signed)
Received 2 call from [place] with [employer]. She states she called earlier and was sent to Medical Records [place]. They gave her a phone # for [facility]. She states the # given was not for [employer]. She called [facility] Records again and was given the same information. She called our office and I was told the same information for [facility]. I looked up phone # on internet and provided to [place]. She contacted [facility] and called me back stating they have never received medical records request for the pt. Pulled request that was scanned in pts documents and refaxed request for medical records to the [facility] office "Urgent". This is the 3rd request.

## 2015-07-12 NOTE — Patient Instructions (Signed)
We will go ahead with an MRI of your knee to assess for a medial meniscus tear. I will call you the business day following this to go over results and next steps.

## 2015-07-14 DIAGNOSIS — M25562 Pain in left knee: Secondary | ICD-10-CM | POA: Insufficient documentation
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
## 2015-07-15 ENCOUNTER — Ambulatory Visit (HOSPITAL_BASED_OUTPATIENT_CLINIC_OR_DEPARTMENT_OTHER)
Admission: RE | Admit: 2015-07-15 | Discharge: 2015-07-15 | Disposition: A | Discharge status: Home / Self Care | Payer: 59 | Source: Ambulatory Visit | Attending: Family Medicine | Admitting: Family Medicine

## 2015-07-15 DIAGNOSIS — M25562 Pain in left knee: Secondary | ICD-10-CM | POA: Diagnosis not present

## 2015-07-15 DIAGNOSIS — M7989 Other specified soft tissue disorders: Secondary | ICD-10-CM | POA: Insufficient documentation

## 2015-07-15 DIAGNOSIS — R531 Weakness: Secondary | ICD-10-CM | POA: Insufficient documentation

## 2015-07-17 NOTE — Addendum Note (Signed)
Addended by: Lenda Kelp on: 07/17/2015 01:43 PM   Modules accepted: Kipp Brood
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
## 2017-03-27 ENCOUNTER — Encounter (HOSPITAL_COMMUNITY): Payer: Self-pay

## 2017-03-27 ENCOUNTER — Emergency Department (HOSPITAL_COMMUNITY)
Admission: EM | Admit: 2017-03-27 | Discharge: 2017-03-27 | Disposition: A | Discharge status: Home / Self Care | Payer: 59 | Attending: Emergency Medicine | Admitting: Emergency Medicine

## 2017-03-27 DIAGNOSIS — Z87891 Personal history of nicotine dependence: Secondary | ICD-10-CM | POA: Insufficient documentation

## 2017-03-27 DIAGNOSIS — Y92410 Unspecified street and highway as the place of occurrence of the external cause: Secondary | ICD-10-CM | POA: Diagnosis not present

## 2017-03-27 DIAGNOSIS — Y939 Activity, unspecified: Secondary | ICD-10-CM | POA: Diagnosis not present

## 2017-03-27 DIAGNOSIS — Y999 Unspecified external cause status: Secondary | ICD-10-CM | POA: Diagnosis not present

## 2017-03-27 DIAGNOSIS — M545 Low back pain, unspecified: Secondary | ICD-10-CM

## 2017-03-27 DIAGNOSIS — S3992XA Unspecified injury of lower back, initial encounter: Secondary | ICD-10-CM | POA: Diagnosis present

## 2017-03-27 MED ORDER — NAPROXEN 500 MG PO TABS: 500.0000 mg | ORAL_TABLET | Freq: Two times a day (BID) | ORAL | 0 refills | Status: DC

## 2017-03-27 MED ORDER — NAPROXEN 250 MG PO TABS
500.0000 mg | ORAL_TABLET | Freq: Once | ORAL | Status: AC
  Administered 2017-03-27: 500 mg via ORAL
  Filled 2017-03-27: qty 2

## 2017-03-27 NOTE — Discharge Instructions (Signed)
Expect to be stiff and sore for the next 2 days. Take Naprosyn as needed. Follow-up with your doctor as needed. Returning get seen for development of any significant headache abdominal pain nausea and vomiting.
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
## 2017-03-27 NOTE — ED Triage Notes (Addendum)
Charting error.
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
## 2017-03-27 NOTE — Discharge Planning (Signed)
Pt up for discharge. EDCM reviewed chart for possible CM needs.  No needs identified or communicated.  

## 2017-11-19 ENCOUNTER — Emergency Department (HOSPITAL_BASED_OUTPATIENT_CLINIC_OR_DEPARTMENT_OTHER)
Admission: EM | Admit: 2017-11-19 | Discharge: 2017-11-19 | Disposition: A | Discharge status: Home / Self Care | Payer: 59 | Attending: Emergency Medicine | Admitting: Emergency Medicine

## 2017-11-19 ENCOUNTER — Encounter (HOSPITAL_BASED_OUTPATIENT_CLINIC_OR_DEPARTMENT_OTHER): Payer: Self-pay | Admitting: Emergency Medicine

## 2017-11-19 ENCOUNTER — Other Ambulatory Visit: Payer: Self-pay

## 2017-11-19 DIAGNOSIS — Z87891 Personal history of nicotine dependence: Secondary | ICD-10-CM | POA: Diagnosis not present

## 2017-11-19 DIAGNOSIS — R1030 Lower abdominal pain, unspecified: Secondary | ICD-10-CM

## 2017-11-19 DIAGNOSIS — N3000 Acute cystitis without hematuria: Secondary | ICD-10-CM | POA: Diagnosis not present

## 2017-11-19 DIAGNOSIS — Z79899 Other long term (current) drug therapy: Secondary | ICD-10-CM | POA: Diagnosis not present

## 2017-11-19 LAB — URINALYSIS, MICROSCOPIC (REFLEX)

## 2017-11-19 LAB — URINALYSIS, ROUTINE W REFLEX MICROSCOPIC
Bilirubin Urine: NEGATIVE
Glucose, UA: NEGATIVE mg/dL
Hgb urine dipstick: NEGATIVE
Ketones, ur: NEGATIVE mg/dL
Leukocytes, UA: NEGATIVE
Nitrite: POSITIVE — AB
Protein, ur: NEGATIVE mg/dL
Specific Gravity, Urine: 1.02 (ref 1.005–1.030)
pH: 7.5 (ref 5.0–8.0)

## 2017-11-19 LAB — PREGNANCY, URINE: Preg Test, Ur: NEGATIVE

## 2017-11-19 MED ORDER — SULFAMETHOXAZOLE-TRIMETHOPRIM 800-160 MG PO TABS: 1.0000 | ORAL_TABLET | Freq: Two times a day (BID) | ORAL | 0 refills | Status: AC

## 2017-11-19 MED ORDER — ONDANSETRON HCL 4 MG PO TABS: 4.0000 mg | ORAL_TABLET | Freq: Three times a day (TID) | ORAL | 0 refills | Status: AC | PRN

## 2017-11-19 NOTE — ED Triage Notes (Signed)
Pt having lower groin/pelvic pain for two days.  No vaginal discharge.  Some dysuria.  Some right sided back pain.  Some nausea.  No fever.
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
## 2017-11-19 NOTE — ED Notes (Signed)
ED Provider at bedside. 

## 2017-11-21 LAB — URINE CULTURE: Culture: >=100000 — AB

## 2017-11-22 ENCOUNTER — Telehealth: Payer: Self-pay

## 2017-11-22 NOTE — Telephone Encounter (Signed)
Post ED Visit - Positive Culture Follow-up  Culture report reviewed by antimicrobial stewardship pharmacist:  []  Enzo BiNathan Batchelder, Pharm.D. []  Celedonio MiyamotoJeremy Frens, Pharm.D., BCPS AQ-ID []  Garvin FilaMike Maccia, Pharm.D., BCPS []  Georgina PillionElizabeth Martin, [address]Ph[address] BCPS []  CorneliusMinh Pham, [address]Ph[address]D., BCPS, AAHIVP []  Estella HuskMichelle Turner, Pharm.D., BCPS, AAHIVP []  Lysle Pearlachel Rumbarger, PharmD, BCPS []  Casilda Carlsaylor Stone, PharmD, BCPS []  Pollyann SamplesAndy Johnston, PharmD, BCPS Sharin MonsEmily Sinclair Pharm D Positive urine culture Treated with Sulfamethoxzole_Trimethoprim, organism sensitive to the same and no further patient follow-up is required at this time.  Jerry CarasCullom, Calen Geister Burnett 11/22/2017, 10:26 AM

## 2018-05-08 ENCOUNTER — Other Ambulatory Visit: Payer: Self-pay

## 2018-05-08 ENCOUNTER — Encounter (HOSPITAL_BASED_OUTPATIENT_CLINIC_OR_DEPARTMENT_OTHER): Payer: Self-pay | Admitting: Adult Health

## 2018-05-08 ENCOUNTER — Emergency Department (HOSPITAL_BASED_OUTPATIENT_CLINIC_OR_DEPARTMENT_OTHER)
Admission: EM | Admit: 2018-05-08 | Discharge: 2018-05-08 | Disposition: A | Discharge status: Home / Self Care | Payer: 59 | Attending: Emergency Medicine | Admitting: Emergency Medicine

## 2018-05-08 DIAGNOSIS — Y998 Other external cause status: Secondary | ICD-10-CM | POA: Insufficient documentation

## 2018-05-08 DIAGNOSIS — W010XXA Fall on same level from slipping, tripping and stumbling without subsequent striking against object, initial encounter: Secondary | ICD-10-CM | POA: Insufficient documentation

## 2018-05-08 DIAGNOSIS — Z87891 Personal history of nicotine dependence: Secondary | ICD-10-CM | POA: Diagnosis not present

## 2018-05-08 DIAGNOSIS — Z79899 Other long term (current) drug therapy: Secondary | ICD-10-CM | POA: Insufficient documentation

## 2018-05-08 DIAGNOSIS — Y9289 Other specified places as the place of occurrence of the external cause: Secondary | ICD-10-CM | POA: Diagnosis not present

## 2018-05-08 DIAGNOSIS — S4992XA Unspecified injury of left shoulder and upper arm, initial encounter: Secondary | ICD-10-CM | POA: Diagnosis present

## 2018-05-08 DIAGNOSIS — Y9389 Activity, other specified: Secondary | ICD-10-CM | POA: Insufficient documentation

## 2018-05-08 DIAGNOSIS — S43402A Unspecified sprain of left shoulder joint, initial encounter: Secondary | ICD-10-CM

## 2018-05-08 MED ORDER — DIAZEPAM 5 MG PO TABS: 5.0000 mg | ORAL_TABLET | Freq: Two times a day (BID) | ORAL | 0 refills | Status: AC | PRN

## 2018-05-08 MED ORDER — NAPROXEN 500 MG PO TABS: 500.0000 mg | ORAL_TABLET | Freq: Two times a day (BID) | ORAL | 0 refills | Status: AC

## 2018-05-08 MED FILL — NAPROXEN 500 MG TABLET: 500 | 15 days supply | Qty: 30 | Fill #0

## 2018-05-08 MED FILL — diazePAM 5 MG TABS: 5 | 5 days supply | Qty: 10 | Fill #0
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
## 2018-05-08 NOTE — ED Triage Notes (Signed)
PResents with left shoulder pain after falling at jiujitsu practice last night and lasnding on left shoulder. She reports that she has a history of shoulder dislocations. She has full ROM but has a lot of pain. She tried aleve with no relief.

## 2018-05-08 NOTE — Discharge Instructions (Signed)
Continue with icing at least 3-4 times per day for 15 to 20 minutes each time.  Take naproxen daily as prescribed for at least 1 week.  You may supplement naproxen with Valium for muscle spasms.  This medication may make you drowsy.  Do not drive or drink alcohol after taking.  Follow-up with sports medicine for further evaluation of your pain.  You may also be seen by orthopedics if desired.  Return for new or concerning symptoms.
